# Patient Record
Sex: Female | Born: 1990 | Race: Black or African American | Hispanic: No | Marital: Single | Smoking: Former smoker
Health system: Southern US, Community
[De-identification: ages and names within clinical notes are randomized; demographics above are authoritative.]

## PROBLEM LIST (undated history)

## (undated) DIAGNOSIS — R011 Cardiac murmur, unspecified: Secondary | ICD-10-CM

## (undated) HISTORY — PX: HAND SURGERY: SHX662

---
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
## 2009-02-08 ENCOUNTER — Ambulatory Visit: Payer: Self-pay

## 2009-08-05 ENCOUNTER — Emergency Department: Payer: Self-pay | Admitting: Emergency Medicine

## 2011-09-05 ENCOUNTER — Emergency Department: Payer: Self-pay | Admitting: Emergency Medicine

## 2011-12-05 ENCOUNTER — Emergency Department: Payer: Self-pay | Admitting: Emergency Medicine

## 2012-02-01 ENCOUNTER — Emergency Department: Payer: Self-pay | Admitting: Unknown Physician Specialty

## 2012-04-21 ENCOUNTER — Emergency Department: Payer: Self-pay | Admitting: Emergency Medicine

## 2012-04-21 LAB — COMPREHENSIVE METABOLIC PANEL
Albumin: 3 g/dL — ABNORMAL LOW (ref 3.4–5.0)
Alkaline Phosphatase: 67 U/L (ref 50–136)
Anion Gap: 6 — ABNORMAL LOW (ref 7–16)
Bilirubin,Total: 0.1 mg/dL — ABNORMAL LOW (ref 0.2–1.0)
Calcium, Total: 8.1 mg/dL — ABNORMAL LOW (ref 8.5–10.1)
Chloride: 107 mmol/L (ref 98–107)
Co2: 26 mmol/L (ref 21–32)
EGFR (African American): >60
SGOT(AST): 16 U/L (ref 15–37)
Total Protein: 7.8 g/dL (ref 6.4–8.2)

## 2012-04-21 LAB — CBC
HCT: 31.9 % — ABNORMAL LOW (ref 35.0–47.0)
HGB: 10.1 g/dL — ABNORMAL LOW (ref 12.0–16.0)
MCV: 66 fL — ABNORMAL LOW (ref 80–100)
RBC: 4.82 10*6/uL (ref 3.80–5.20)
RDW: 18.4 % — ABNORMAL HIGH (ref 11.5–14.5)
WBC: 6.9 10*3/uL (ref 3.6–11.0)

## 2012-04-21 LAB — SALICYLATE LEVEL: Salicylates, Serum: 1.8 mg/dL

## 2012-04-21 LAB — ETHANOL
Ethanol %: <0.003 % (ref 0.000–0.080)
Ethanol: <3 mg/dL

## 2012-04-22 LAB — DRUG SCREEN, URINE
Amphetamines, Ur Screen: NEGATIVE (ref ?–1000)
Benzodiazepine, Ur Scrn: NEGATIVE (ref ?–200)
MDMA (Ecstasy)Ur Screen: NEGATIVE (ref ?–500)
Methadone, Ur Screen: NEGATIVE (ref ?–300)
Phencyclidine (PCP) Ur S: NEGATIVE (ref ?–25)
Tricyclic, Ur Screen: NEGATIVE (ref ?–1000)

## 2012-04-22 LAB — ACETAMINOPHEN LEVEL: Acetaminophen: <2 ug/mL

## 2012-05-04 ENCOUNTER — Emergency Department: Payer: Self-pay | Admitting: Emergency Medicine

## 2012-05-04 LAB — CBC
HCT: 35.2 % (ref 35.0–47.0)
HGB: 11.4 g/dL — ABNORMAL LOW (ref 12.0–16.0)
MCV: 65 fL — ABNORMAL LOW (ref 80–100)
Platelet: 265 10*3/uL (ref 150–440)
RBC: 5.44 10*6/uL — ABNORMAL HIGH (ref 3.80–5.20)
RDW: 17.7 % — ABNORMAL HIGH (ref 11.5–14.5)
WBC: 5.6 10*3/uL (ref 3.6–11.0)

## 2012-05-04 LAB — URINALYSIS, COMPLETE
Bacteria: NONE SEEN
Blood: NEGATIVE
Glucose,UR: NEGATIVE mg/dL (ref 0–75)
Nitrite: NEGATIVE
Ph: 6 (ref 4.5–8.0)
Protein: 30
RBC,UR: 4 /HPF (ref 0–5)
Specific Gravity: 1.03 (ref 1.003–1.030)
Squamous Epithelial: 7

## 2012-05-04 LAB — COMPREHENSIVE METABOLIC PANEL
Albumin: 3.4 g/dL (ref 3.4–5.0)
Alkaline Phosphatase: 75 U/L (ref 50–136)
Anion Gap: 9 (ref 7–16)
Calcium, Total: 8.9 mg/dL (ref 8.5–10.1)
Chloride: 105 mmol/L (ref 98–107)
EGFR (African American): >60
EGFR (Non-African Amer.): >60
Glucose: 124 mg/dL — ABNORMAL HIGH (ref 65–99)
Osmolality: 273 (ref 275–301)
Potassium: 3.4 mmol/L — ABNORMAL LOW (ref 3.5–5.1)
SGOT(AST): 18 U/L (ref 15–37)
Sodium: 137 mmol/L (ref 136–145)

## 2012-05-04 LAB — PREGNANCY, URINE: Pregnancy Test, Urine: NEGATIVE m[IU]/mL

## 2012-05-05 LAB — WET PREP, GENITAL

## 2012-09-12 ENCOUNTER — Emergency Department: Payer: Self-pay | Admitting: Emergency Medicine

## 2012-09-12 LAB — COMPREHENSIVE METABOLIC PANEL
Alkaline Phosphatase: 77 U/L (ref 50–136)
Anion Gap: 6 — ABNORMAL LOW (ref 7–16)
Calcium, Total: 8.9 mg/dL (ref 8.5–10.1)
Chloride: 104 mmol/L (ref 98–107)
Osmolality: 269 (ref 275–301)
SGOT(AST): 20 U/L (ref 15–37)
SGPT (ALT): 21 U/L (ref 12–78)
Sodium: 136 mmol/L (ref 136–145)

## 2012-09-12 LAB — URINALYSIS, COMPLETE
Bacteria: NONE SEEN
Bilirubin,UR: NEGATIVE
Glucose,UR: NEGATIVE mg/dL (ref 0–75)
Ketone: NEGATIVE
Leukocyte Esterase: NEGATIVE
Nitrite: NEGATIVE
Protein: NEGATIVE
RBC,UR: 1 /HPF (ref 0–5)
Specific Gravity: 1.021 (ref 1.003–1.030)
Squamous Epithelial: 7

## 2012-09-12 LAB — CBC
HCT: 34.3 % — ABNORMAL LOW (ref 35.0–47.0)
MCHC: 31.4 g/dL — ABNORMAL LOW (ref 32.0–36.0)
RBC: 5.26 10*6/uL — ABNORMAL HIGH (ref 3.80–5.20)
RDW: 18.3 % — ABNORMAL HIGH (ref 11.5–14.5)
WBC: 6.2 10*3/uL (ref 3.6–11.0)

## 2012-09-25 ENCOUNTER — Emergency Department: Payer: Self-pay | Admitting: Internal Medicine

## 2012-12-21 ENCOUNTER — Emergency Department: Payer: Self-pay | Admitting: Emergency Medicine

## 2012-12-23 LAB — BETA STREP CULTURE(ARMC)

## 2013-02-05 ENCOUNTER — Emergency Department: Payer: Self-pay | Admitting: Emergency Medicine

## 2013-03-09 ENCOUNTER — Emergency Department: Payer: Self-pay | Admitting: Internal Medicine

## 2013-03-09 LAB — URINALYSIS, COMPLETE
Bilirubin,UR: NEGATIVE
Glucose,UR: NEGATIVE mg/dL (ref 0–75)
Ketone: NEGATIVE
Leukocyte Esterase: NEGATIVE
Nitrite: NEGATIVE
RBC,UR: 1 /HPF (ref 0–5)
Squamous Epithelial: 8
WBC UR: 2 /HPF (ref 0–5)

## 2013-03-09 LAB — COMPREHENSIVE METABOLIC PANEL
Albumin: 3 g/dL — ABNORMAL LOW (ref 3.4–5.0)
Alkaline Phosphatase: 74 U/L (ref 50–136)
Anion Gap: 5 — ABNORMAL LOW (ref 7–16)
BUN: 6 mg/dL — ABNORMAL LOW (ref 7–18)
Chloride: 105 mmol/L (ref 98–107)
Creatinine: 0.81 mg/dL (ref 0.60–1.30)
EGFR (African American): >60
EGFR (Non-African Amer.): >60
Glucose: 92 mg/dL (ref 65–99)
Potassium: 3.5 mmol/L (ref 3.5–5.1)
SGPT (ALT): 12 U/L (ref 12–78)
Sodium: 138 mmol/L (ref 136–145)
Total Protein: 7.6 g/dL (ref 6.4–8.2)

## 2013-03-09 LAB — CBC
HCT: 34.2 % — ABNORMAL LOW (ref 35.0–47.0)
HGB: 10.9 g/dL — ABNORMAL LOW (ref 12.0–16.0)
MCHC: 31.8 g/dL — ABNORMAL LOW (ref 32.0–36.0)
MCV: 66 fL — ABNORMAL LOW (ref 80–100)
RBC: 5.15 10*6/uL (ref 3.80–5.20)
WBC: 6.6 10*3/uL (ref 3.6–11.0)

## 2013-06-06 ENCOUNTER — Emergency Department: Payer: Self-pay | Admitting: Emergency Medicine

## 2013-06-06 LAB — URINALYSIS, COMPLETE
Blood: NEGATIVE
Nitrite: NEGATIVE
Protein: NEGATIVE
Specific Gravity: 1.026 (ref 1.003–1.030)
Squamous Epithelial: 3
WBC UR: 2 /HPF (ref 0–5)

## 2013-06-06 LAB — PREGNANCY, URINE: Pregnancy Test, Urine: NEGATIVE m[IU]/mL

## 2013-06-06 LAB — HCG, QUANTITATIVE, PREGNANCY: Beta Hcg, Quant.: <1 m[IU]/mL — ABNORMAL LOW

## 2013-06-30 ENCOUNTER — Emergency Department: Payer: Self-pay | Admitting: Emergency Medicine

## 2013-06-30 LAB — BASIC METABOLIC PANEL
ANION GAP: 7 (ref 7–16)
BUN: 6 mg/dL — AB (ref 7–18)
Calcium, Total: 8.9 mg/dL (ref 8.5–10.1)
Chloride: 102 mmol/L (ref 98–107)
Co2: 24 mmol/L (ref 21–32)
Creatinine: 0.82 mg/dL (ref 0.60–1.30)
EGFR (African American): >60
GLUCOSE: 86 mg/dL (ref 65–99)
OSMOLALITY: 263 (ref 275–301)
Potassium: 3.4 mmol/L — ABNORMAL LOW (ref 3.5–5.1)
Sodium: 133 mmol/L — ABNORMAL LOW (ref 136–145)

## 2013-06-30 LAB — CBC WITH DIFFERENTIAL/PLATELET
BASOS PCT: 0.5 %
Basophil #: 0.1 10*3/uL (ref 0.0–0.1)
EOS PCT: 0.1 %
Eosinophil #: 0 10*3/uL (ref 0.0–0.7)
HCT: 34.9 % — ABNORMAL LOW (ref 35.0–47.0)
HGB: 10.9 g/dL — AB (ref 12.0–16.0)
LYMPHS ABS: 1.8 10*3/uL (ref 1.0–3.6)
LYMPHS PCT: 12 %
MCH: 20.3 pg — ABNORMAL LOW (ref 26.0–34.0)
MCHC: 31.1 g/dL — AB (ref 32.0–36.0)
MCV: 65 fL — ABNORMAL LOW (ref 80–100)
Monocyte #: 1.3 x10 3/mm — ABNORMAL HIGH (ref 0.2–0.9)
Monocyte %: 8.7 %
Neutrophil #: 11.9 10*3/uL — ABNORMAL HIGH (ref 1.4–6.5)
Neutrophil %: 78.7 %
PLATELETS: 276 10*3/uL (ref 150–440)
RBC: 5.34 10*6/uL — ABNORMAL HIGH (ref 3.80–5.20)
RDW: 17.7 % — ABNORMAL HIGH (ref 11.5–14.5)
WBC: 15.1 10*3/uL — AB (ref 3.6–11.0)

## 2013-06-30 LAB — MONONUCLEOSIS SCREEN: MONO TEST: NEGATIVE

## 2013-07-02 LAB — BETA STREP CULTURE(ARMC)

## 2013-11-10 ENCOUNTER — Emergency Department: Payer: Self-pay | Admitting: Emergency Medicine

## 2014-01-11 ENCOUNTER — Emergency Department: Payer: Self-pay | Admitting: Emergency Medicine

## 2014-01-12 LAB — COMPREHENSIVE METABOLIC PANEL
ALBUMIN: 3.8 g/dL (ref 3.4–5.0)
AST: 51 U/L — AB (ref 15–37)
Alkaline Phosphatase: 75 U/L
Anion Gap: 8 (ref 7–16)
BILIRUBIN TOTAL: 0.3 mg/dL (ref 0.2–1.0)
BUN: 8 mg/dL (ref 7–18)
CHLORIDE: 103 mmol/L (ref 98–107)
CO2: 27 mmol/L (ref 21–32)
Calcium, Total: 8.8 mg/dL (ref 8.5–10.1)
Creatinine: 0.89 mg/dL (ref 0.60–1.30)
EGFR (African American): >60
EGFR (Non-African Amer.): >60
Glucose: 94 mg/dL (ref 65–99)
Osmolality: 274 (ref 275–301)
Potassium: 3.5 mmol/L (ref 3.5–5.1)
SGPT (ALT): 28 U/L
Sodium: 138 mmol/L (ref 136–145)
TOTAL PROTEIN: 8.7 g/dL — AB (ref 6.4–8.2)

## 2014-01-12 LAB — CBC WITH DIFFERENTIAL/PLATELET
BASOS ABS: 0 10*3/uL (ref 0.0–0.1)
BASOS PCT: 0.5 %
EOS ABS: 0.2 10*3/uL (ref 0.0–0.7)
Eosinophil %: 1.8 %
HCT: 36.2 % (ref 35.0–47.0)
HGB: 11.2 g/dL — ABNORMAL LOW (ref 12.0–16.0)
Lymphocyte #: 3.2 10*3/uL (ref 1.0–3.6)
Lymphocyte %: 37.3 %
MCH: 20.8 pg — ABNORMAL LOW (ref 26.0–34.0)
MCHC: 30.9 g/dL — ABNORMAL LOW (ref 32.0–36.0)
MCV: 67 fL — ABNORMAL LOW (ref 80–100)
MONOS PCT: 11.2 %
Monocyte #: 1 x10 3/mm — ABNORMAL HIGH (ref 0.2–0.9)
NEUTROS ABS: 4.2 10*3/uL (ref 1.4–6.5)
NEUTROS PCT: 49.2 %
PLATELETS: 273 10*3/uL (ref 150–440)
RBC: 5.38 10*6/uL — ABNORMAL HIGH (ref 3.80–5.20)
RDW: 18.1 % — ABNORMAL HIGH (ref 11.5–14.5)
WBC: 8.6 10*3/uL (ref 3.6–11.0)

## 2014-01-12 LAB — URINALYSIS, COMPLETE
Bilirubin,UR: NEGATIVE
Blood: NEGATIVE
Glucose,UR: NEGATIVE mg/dL (ref 0–75)
KETONE: NEGATIVE
LEUKOCYTE ESTERASE: NEGATIVE
Nitrite: NEGATIVE
PH: 6 (ref 4.5–8.0)
RBC,UR: 9 /HPF (ref 0–5)
Specific Gravity: 1.03 (ref 1.003–1.030)
Squamous Epithelial: 5

## 2014-01-12 LAB — GC/CHLAMYDIA PROBE AMP

## 2014-01-12 LAB — WET PREP, GENITAL

## 2014-03-03 ENCOUNTER — Emergency Department: Payer: Self-pay | Admitting: Emergency Medicine

## 2014-03-03 LAB — CBC
HCT: 35.5 % (ref 35.0–47.0)
HGB: 10.7 g/dL — ABNORMAL LOW (ref 12.0–16.0)
MCH: 20 pg — ABNORMAL LOW (ref 26.0–34.0)
MCHC: 30 g/dL — AB (ref 32.0–36.0)
MCV: 67 fL — AB (ref 80–100)
PLATELETS: 287 10*3/uL (ref 150–440)
RBC: 5.33 10*6/uL — ABNORMAL HIGH (ref 3.80–5.20)
RDW: 17.7 % — ABNORMAL HIGH (ref 11.5–14.5)
WBC: 6 10*3/uL (ref 3.6–11.0)

## 2014-06-08 DIAGNOSIS — E669 Obesity, unspecified: Secondary | ICD-10-CM | POA: Insufficient documentation

## 2014-06-13 ENCOUNTER — Emergency Department: Payer: Self-pay | Admitting: Student

## 2014-06-13 LAB — URINALYSIS, COMPLETE
BILIRUBIN, UR: NEGATIVE
BLOOD: NEGATIVE
Glucose,UR: NEGATIVE mg/dL (ref 0–75)
KETONE: NEGATIVE
Nitrite: NEGATIVE
PH: 6 (ref 4.5–8.0)
Protein: 30
RBC,UR: 3 /HPF (ref 0–5)
Specific Gravity: 1.026 (ref 1.003–1.030)
Squamous Epithelial: 18
WBC UR: 6 /HPF (ref 0–5)

## 2014-06-13 LAB — WET PREP, GENITAL

## 2014-06-13 LAB — CBC WITH DIFFERENTIAL/PLATELET
BASOS PCT: 0.4 %
Basophil #: 0 10*3/uL (ref 0.0–0.1)
Eosinophil #: 0.1 10*3/uL (ref 0.0–0.7)
Eosinophil %: 1.6 %
HCT: 33.8 % — ABNORMAL LOW (ref 35.0–47.0)
HGB: 10.5 g/dL — AB (ref 12.0–16.0)
LYMPHS ABS: 2.3 10*3/uL (ref 1.0–3.6)
Lymphocyte %: 31.5 %
MCH: 20.7 pg — AB (ref 26.0–34.0)
MCHC: 31 g/dL — ABNORMAL LOW (ref 32.0–36.0)
MCV: 67 fL — ABNORMAL LOW (ref 80–100)
Monocyte #: 0.7 x10 3/mm (ref 0.2–0.9)
Monocyte %: 9.7 %
Neutrophil #: 4.1 10*3/uL (ref 1.4–6.5)
Neutrophil %: 56.8 %
Platelet: 357 10*3/uL (ref 150–440)
RBC: 5.06 10*6/uL (ref 3.80–5.20)
RDW: 17.3 % — AB (ref 11.5–14.5)
WBC: 7.2 10*3/uL (ref 3.6–11.0)

## 2014-06-13 LAB — COMPREHENSIVE METABOLIC PANEL
ALBUMIN: 3.7 g/dL (ref 3.4–5.0)
Alkaline Phosphatase: 82 U/L
Anion Gap: 3 — ABNORMAL LOW (ref 7–16)
BILIRUBIN TOTAL: 0.3 mg/dL (ref 0.2–1.0)
BUN: 7 mg/dL (ref 7–18)
Calcium, Total: 8.9 mg/dL (ref 8.5–10.1)
Chloride: 108 mmol/L — ABNORMAL HIGH (ref 98–107)
Co2: 28 mmol/L (ref 21–32)
Creatinine: 0.9 mg/dL (ref 0.60–1.30)
EGFR (African American): >60
EGFR (Non-African Amer.): >60
GLUCOSE: 93 mg/dL (ref 65–99)
OSMOLALITY: 275 (ref 275–301)
Potassium: 3.7 mmol/L (ref 3.5–5.1)
SGOT(AST): 18 U/L (ref 15–37)
SGPT (ALT): 20 U/L
SODIUM: 139 mmol/L (ref 136–145)
Total Protein: 8.8 g/dL — ABNORMAL HIGH (ref 6.4–8.2)

## 2014-06-13 LAB — GC/CHLAMYDIA PROBE AMP

## 2014-06-13 LAB — LIPASE, BLOOD: Lipase: 155 U/L (ref 73–393)

## 2014-07-11 ENCOUNTER — Emergency Department: Payer: Self-pay | Admitting: Internal Medicine

## 2014-07-11 LAB — URINALYSIS, COMPLETE
BACTERIA: NONE SEEN
BILIRUBIN, UR: NEGATIVE
Glucose,UR: NEGATIVE mg/dL (ref 0–75)
KETONE: NEGATIVE
Nitrite: NEGATIVE
Ph: 6 (ref 4.5–8.0)
Protein: 100
RBC,UR: 2974 /HPF (ref 0–5)
Specific Gravity: 1.025 (ref 1.003–1.030)
WBC UR: 7 /HPF (ref 0–5)

## 2014-07-11 LAB — HCG, QUANTITATIVE, PREGNANCY: Beta Hcg, Quant.: <1 m[IU]/mL — ABNORMAL LOW

## 2014-07-11 LAB — CBC
HCT: 32.8 % — ABNORMAL LOW (ref 35.0–47.0)
HGB: 10 g/dL — ABNORMAL LOW (ref 12.0–16.0)
MCH: 19.8 pg — ABNORMAL LOW (ref 26.0–34.0)
MCHC: 30.6 g/dL — AB (ref 32.0–36.0)
MCV: 65 fL — ABNORMAL LOW (ref 80–100)
Platelet: 306 10*3/uL (ref 150–440)
RBC: 5.08 10*6/uL (ref 3.80–5.20)
RDW: 16.5 % — AB (ref 11.5–14.5)
WBC: 8 10*3/uL (ref 3.6–11.0)

## 2014-07-30 ENCOUNTER — Emergency Department: Payer: Self-pay | Admitting: Emergency Medicine

## 2014-09-08 ENCOUNTER — Emergency Department: Payer: Self-pay | Admitting: Emergency Medicine

## 2015-03-22 ENCOUNTER — Encounter: Payer: Self-pay | Admitting: Emergency Medicine

## 2015-03-22 ENCOUNTER — Emergency Department: Payer: Self-pay

## 2015-03-22 ENCOUNTER — Emergency Department
Admission: EM | Admit: 2015-03-22 | Discharge: 2015-03-22 | Disposition: A | Discharge status: Home / Self Care | Payer: Self-pay | Attending: Emergency Medicine | Admitting: Emergency Medicine

## 2015-03-22 DIAGNOSIS — Y9289 Other specified places as the place of occurrence of the external cause: Secondary | ICD-10-CM | POA: Insufficient documentation

## 2015-03-22 DIAGNOSIS — Z87891 Personal history of nicotine dependence: Secondary | ICD-10-CM | POA: Insufficient documentation

## 2015-03-22 DIAGNOSIS — W208XXA Other cause of strike by thrown, projected or falling object, initial encounter: Secondary | ICD-10-CM | POA: Insufficient documentation

## 2015-03-22 DIAGNOSIS — Y998 Other external cause status: Secondary | ICD-10-CM | POA: Insufficient documentation

## 2015-03-22 DIAGNOSIS — Y9389 Activity, other specified: Secondary | ICD-10-CM | POA: Insufficient documentation

## 2015-03-22 DIAGNOSIS — S9031XA Contusion of right foot, initial encounter: Secondary | ICD-10-CM | POA: Insufficient documentation

## 2015-03-22 MED ORDER — IBUPROFEN 800 MG PO TABS
800.0000 mg | ORAL_TABLET | Freq: Once | ORAL | Status: AC
  Administered 2015-03-22: 800 mg via ORAL
  Filled 2015-03-22: qty 1

## 2015-03-22 MED ORDER — IBUPROFEN 800 MG PO TABS: 800.0000 mg | ORAL_TABLET | Freq: Three times a day (TID) | ORAL | Status: DC | PRN

## 2015-03-22 MED ORDER — TRAMADOL HCL 50 MG PO TABS: 50.0000 mg | ORAL_TABLET | Freq: Four times a day (QID) | ORAL | Status: DC | PRN

## 2015-03-22 MED ORDER — TRAMADOL HCL 50 MG PO TABS
50.0000 mg | ORAL_TABLET | Freq: Once | ORAL | Status: AC
  Administered 2015-03-22: 50 mg via ORAL
  Filled 2015-03-22: qty 1

## 2015-03-22 NOTE — ED Provider Notes (Signed)
[facility] Emergency Department Provider Note  ____________________________________________  Time seen: Approximately 2:42 PM  I have reviewed the triage vital signs and the nursing notes.   HISTORY  Chief Complaint Ankle Pain    HPI Linda Sawyer is a 24 y.o. female complaining of right ankle pain as a dropping a heavy box on the ankle this morning. Patient states pain is 8/10. Describes pain as sharp. Except for ice placed on the ankle was triaged no other palliative measures for this complaint.   History reviewed. No pertinent past medical history.  There are no active problems to display for this patient.   No past surgical history on file.  No current outpatient prescriptions on file.  Allergies Review of patient's allergies indicates no known allergies.  No family history on file.  Social History Social History  Substance Use Topics  . Smoking status: Former Smoker    Quit date: 03/01/2015  . Smokeless tobacco: None  . Alcohol Use: No    Review of Systems Constitutional: No fever/chills Eyes: No visual changes. ENT: No sore throat. Cardiovascular: Denies chest pain. Respiratory: Denies shortness of breath. Gastrointestinal: No abdominal pain.  No nausea, no vomiting.  No diarrhea.  No constipation. Genitourinary: Negative for dysuria. Musculoskeletal: Right ankle pain Skin: Negative for rash. Neurological: Negative for headaches, focal weakness or numbness. 10-point ROS otherwise negative.  ____________________________________________   PHYSICAL EXAM:  VITAL SIGNS: ED Triage Vitals  Enc Vitals Group     BP 03/22/15 1428 134/80 mmHg     Pulse Rate 03/22/15 1428 65     Resp 03/22/15 1428 16     Temp 03/22/15 1428 98.4 F (36.9 C)     Temp Source 03/22/15 1428 Oral     SpO2 03/22/15 1428 99 %     Weight 03/22/15 1428 310 lb (140.615 kg)     Height 03/22/15 1428  (1.778 m)     Head Cir --      Peak Flow --    Pain Score 03/22/15 1429 8     Pain Loc --      Pain Edu? --      Excl. in GC? --     Constitutional: Alert and oriented. Well appearing and in no acute distress. Eyes: Conjunctivae are normal. PERRL. EOMI. Head: Atraumatic. Nose: No congestion/rhinnorhea. Mouth/Throat: Mucous membranes are moist.  Oropharynx non-erythematous. Neck: No stridor.  No cervical spine tenderness to palpation. Hematological/Lymphatic/Immunilogical: No cervical lymphadenopathy. Cardiovascular: Normal rate, regular rhythm. Grossly normal heart sounds.  Good peripheral circulation. Respiratory: Normal respiratory effort.  No retractions. Lungs CTAB. Gastrointestinal: Soft and nontender. No distention. No abdominal bruits. No CVA tenderness. Musculoskeletal: No lower extremity tenderness nor edema.  No joint effusions. Neurologic:  Normal speech and language. No gross focal neurologic deficits are appreciated. No gait instability. Skin:  Skin is warm, dry and intact. No rash noted. Psychiatric: Mood and affect are normal. Speech and behavior are normal.  ____________________________________________   LABS (all labs ordered are listed, but only abnormal results are displayed)  Labs Reviewed - No data to display ____________________________________________  EKG   ____________________________________________  RADIOLOGY  Right ankle x-rays unremarkable. I, Joni Reining, personally viewed and evaluated these images (plain radiographs) as part of my medical decision making.   ____________________________________________   PROCEDURES  Procedure(s) performed: None  Critical Care performed: No  ____________________________________________   INITIAL IMPRESSION / ASSESSMENT AND PLAN / ED COURSE  Pertinent labs & imaging results that were available  during my care of the patient were reviewed by me and considered in my medical decision making (see chart for details).  Right foot contusion. Patient  advised to wear open shoe for 2-3 days. Patient given a prescription for tramadol and ibuprofen. Patient given a work note for 2 days. [facility] if condition persists. ____________________________________________   FINAL CLINICAL IMPRESSION(S) / ED DIAGNOSES  Final diagnoses:  Foot contusion, right, initial encounter      Joni Reining, PA-C 03/22/15 1530  Myrna Blazer, MD 03/22/15 [phone]

## 2015-03-22 NOTE — Discharge Instructions (Signed)
Foot Contusion  A foot contusion is a deep bruise to the foot. Contusions happen when an injury causes bleeding under the skin. Signs of bruising include pain, puffiness (swelling), and discolored skin. The contusion may turn blue, purple, or yellow. HOME CARE  Put ice on the injured area.  Put ice in a plastic bag.  Place a towel between your skin and the bag.  Leave the ice on for 15-20 minutes, 03-04 times a day.  Only take medicines as told by your doctor.  Use an elastic wrap only as told. You may remove the wrap for sleeping, showering, and bathing. Take the wrap off if you lose feeling (numb) in your toes, or they turn blue or cold. Put the wrap on more loosely.  Keep the foot raised (elevated) with pillows.  If your foot hurts, avoid standing or walking.  When your doctor says it is okay to use your foot, start using it slowly. If you have pain, lessen how much you use your foot.  See your doctor as told. GET HELP RIGHT AWAY IF:   You have more redness, puffiness, or pain in your foot.  Your puffiness or pain does not get better with medicine.  You lose feeling in your foot, or you cannot move your toes.  Your foot turns cold or blue.  You have pain when you move your toes.  Your foot feels warm.  Your contusion does not get better in 2 days. MAKE SURE YOU:   Understand these instructions.  Will watch this condition.  Will get help right away if you or your child is not doing well or gets worse.   This information is not intended to replace advice given to you by your health care provider. Make sure you discuss any questions you have with your health care provider.   Document Released: 03/07/2008 Document Revised: 11/28/2011 Document Reviewed: 02/02/2015 Elsevier Interactive Patient Education 2016 [employer]  

## 2015-03-22 NOTE — ED Notes (Signed)
Patient presents to the ED with right ankle pain after dropping a box on her right ankle around 10am.  Patient is in no obvious distress at this time.

## 2015-03-25 ENCOUNTER — Emergency Department
Admission: EM | Admit: 2015-03-25 | Discharge: 2015-03-25 | Disposition: A | Discharge status: Home / Self Care | Payer: Self-pay | Attending: Emergency Medicine | Admitting: Emergency Medicine

## 2015-03-25 ENCOUNTER — Encounter: Payer: Self-pay | Admitting: Emergency Medicine

## 2015-03-25 ENCOUNTER — Emergency Department: Payer: Self-pay

## 2015-03-25 DIAGNOSIS — S9002XD Contusion of left ankle, subsequent encounter: Secondary | ICD-10-CM

## 2015-03-25 DIAGNOSIS — Z87891 Personal history of nicotine dependence: Secondary | ICD-10-CM | POA: Insufficient documentation

## 2015-03-25 DIAGNOSIS — W208XXD Other cause of strike by thrown, projected or falling object, subsequent encounter: Secondary | ICD-10-CM | POA: Insufficient documentation

## 2015-03-25 DIAGNOSIS — S9001XD Contusion of right ankle, subsequent encounter: Secondary | ICD-10-CM | POA: Insufficient documentation

## 2015-03-25 MED ORDER — TRAMADOL HCL 50 MG PO TABS: 50.0000 mg | ORAL_TABLET | Freq: Four times a day (QID) | ORAL | Status: DC | PRN

## 2015-03-25 MED ORDER — NAPROXEN 500 MG PO TABS: 500.0000 mg | ORAL_TABLET | Freq: Two times a day (BID) | ORAL | Status: DC

## 2015-03-25 MED ORDER — TRAMADOL HCL 50 MG PO TABS
50.0000 mg | ORAL_TABLET | Freq: Once | ORAL | Status: AC
  Administered 2015-03-25: 50 mg via ORAL
  Filled 2015-03-25: qty 1

## 2015-03-25 NOTE — ED Provider Notes (Signed)
[facility] Emergency Department Provider Note ____________________________________________  Time seen: Approximately 4:10 PM  I have reviewed the triage vital signs and the nursing notes.   HISTORY  Chief Complaint Ankle Pain   HPI Linda Sawyer is a 24 y.o. female who presents to the emergency department for a second evaluation of right ankle pain. She states that she was sitting with her legs in front of her, reached over to pick up a box of tools, and it slipped out of her hand and felt directly onto her right ankle. She states that she was examined on 03/22/2015 for the same and the x-ray was negative. She states that the pain has increased since her last visit and is unable to bear weight. She is not taking anything for pain. She is not wearing the postop shoe was given to her on the 10th. She has not scheduled a follow-up appointment [facility] or [facility] as previously advised.She states that she attempted to go to work today, but was unable to stay due to the pain.   History reviewed. No pertinent past medical history.  There are no active problems to display for this patient.   Past Surgical History  Procedure Laterality Date  . Hand surgery      Current Outpatient Rx  Name  Route  Sig  Dispense  Refill  . naproxen (NAPROSYN) 500 MG tablet   Oral   Take 1 tablet (500 mg total) by mouth 2 (two) times daily with a meal.   60 tablet   2   . traMADol (ULTRAM) 50 MG tablet   Oral   Take 1 tablet (50 mg total) by mouth every 6 (six) hours as needed.   9 tablet   0     Allergies Review of patient's allergies indicates no known allergies.  No family history on file.  Social History Social History  Substance Use Topics  . Smoking status: Former Smoker    Quit date: 03/01/2015  . Smokeless tobacco: None  . Alcohol Use: No    Review of Systems Constitutional: No recent illness. Eyes: No visual changes. ENT: No sore  throat. Cardiovascular: Denies chest pain or palpitations. Respiratory: Denies shortness of breath. Gastrointestinal: No abdominal pain.  Genitourinary: Negative for dysuria. Musculoskeletal: Pain in right ankle Skin: Negative for rash. Neurological: Negative for headaches, focal weakness or numbness. 10-point ROS otherwise negative.  ____________________________________________   PHYSICAL EXAM:  VITAL SIGNS: ED Triage Vitals  Enc Vitals Group     BP 03/25/15 1609 97/75 mmHg     Pulse Rate 03/25/15 1609 77     Resp 03/25/15 1609 18     Temp 03/25/15 1609 98.4 F (36.9 C)     Temp Source 03/25/15 1609 Oral     SpO2 03/25/15 1609 98 %     Weight 03/25/15 1600 310 lb (140.615 kg)     Height 03/25/15 1600 5\' 10"  (1.778 m)     Head Cir --      Peak Flow --      Pain Score 03/25/15 1600 8     Pain Loc --      Pain Edu? --      Excl. in GC? --     Constitutional: Alert and oriented. Well appearing and in no acute distress. Eyes: Conjunctivae are normal. EOMI. Head: Atraumatic. Nose: No congestion/rhinnorhea. Neck: No stridor.  Respiratory: Normal respiratory effort.   Musculoskeletal: Patient reports tenderness and pain with every point on  the [place] ankle rules exam. Neurologic:  Normal speech and language. No gross focal neurologic deficits are appreciated. Speech is normal. No gait instability. Skin:  Skin is warm, dry and intact. Atraumatic. Psychiatric: Mood and affect are normal. Speech and behavior are normal.  ____________________________________________   LABS (all labs ordered are listed, but only abnormal results are displayed)  Labs Reviewed - No data to display ____________________________________________  RADIOLOGY  Negative for acute bony abnormality. ____________________________________________   PROCEDURES  Procedure(s) performed:  SPLINT APPLICATION Date/Time: 3:31 PM Authorized by: Kem Boroughs Consent: Verbal consent obtained. Risks and  benefits: risks, benefits and alternatives were discussed Consent given by: patient Splint applied by: Theodoro Grist, ER Tech Location details: right foot/ankle Splint type: Velcro  Supplies used: Velcro stirrup splint. Post-procedure: The splinted body part was neurovascularly unchanged following the procedure. Patient tolerance: Patient tolerated the procedure well with no immediate complications.      ____________________________________________   INITIAL IMPRESSION / ASSESSMENT AND PLAN / ED COURSE  Pertinent labs & imaging results that were available during my care of the patient were reviewed by me and considered in my medical decision making (see chart for details).  Patient was advised to follow up with orthopedics for symptoms that are not improving over the week. She was advised to return to the emergency department for symptoms that change or worsen if unable to schedule an appointment. ____________________________________________   FINAL CLINICAL IMPRESSION(S) / ED DIAGNOSES  Final diagnoses:  Ankle contusion, left, subsequent encounter       Chinita Pester, FNP 03/26/15 1532  Sharman Cheek, MD 03/27/15 [phone]

## 2015-03-25 NOTE — ED Notes (Signed)
Pt dropped a box on her right ankle on Monday, states that today she feels its getting worse, unable to bear too much weight on it, states her ankle is swollen.

## 2015-03-25 NOTE — Discharge Instructions (Signed)
Follow up with the orthopedic doctor for symptoms that are not improving over the next few days.

## 2015-04-09 ENCOUNTER — Emergency Department
Admission: EM | Admit: 2015-04-09 | Discharge: 2015-04-09 | Disposition: A | Discharge status: Home / Self Care | Payer: Self-pay | Attending: Emergency Medicine | Admitting: Emergency Medicine

## 2015-04-09 DIAGNOSIS — Z87891 Personal history of nicotine dependence: Secondary | ICD-10-CM | POA: Insufficient documentation

## 2015-04-09 DIAGNOSIS — Z3202 Encounter for pregnancy test, result negative: Secondary | ICD-10-CM | POA: Insufficient documentation

## 2015-04-09 DIAGNOSIS — Z791 Long term (current) use of non-steroidal anti-inflammatories (NSAID): Secondary | ICD-10-CM | POA: Insufficient documentation

## 2015-04-09 DIAGNOSIS — N390 Urinary tract infection, site not specified: Secondary | ICD-10-CM | POA: Insufficient documentation

## 2015-04-09 LAB — URINALYSIS COMPLETE WITH MICROSCOPIC (ARMC ONLY)
BILIRUBIN URINE: NEGATIVE
GLUCOSE, UA: NEGATIVE mg/dL
HGB URINE DIPSTICK: NEGATIVE
Ketones, ur: NEGATIVE mg/dL
NITRITE: NEGATIVE
PH: 6 (ref 5.0–8.0)
Protein, ur: 100 mg/dL — AB
SPECIFIC GRAVITY, URINE: 1.017 (ref 1.005–1.030)

## 2015-04-09 LAB — CBC WITH DIFFERENTIAL/PLATELET
BASOS ABS: 0 10*3/uL (ref 0–0.1)
Basophils Relative: 1 %
Eosinophils Absolute: 0.1 10*3/uL (ref 0–0.7)
Eosinophils Relative: 1 %
HEMATOCRIT: 32.6 % — AB (ref 35.0–47.0)
HEMOGLOBIN: 10 g/dL — AB (ref 12.0–16.0)
LYMPHS ABS: 2.6 10*3/uL (ref 1.0–3.6)
LYMPHS PCT: 34 %
MCH: 19.5 pg — AB (ref 26.0–34.0)
MCHC: 30.5 g/dL — ABNORMAL LOW (ref 32.0–36.0)
MCV: 64 fL — AB (ref 80.0–100.0)
Monocytes Absolute: 0.6 10*3/uL (ref 0.2–0.9)
Monocytes Relative: 8 %
NEUTROS ABS: 4.3 10*3/uL (ref 1.4–6.5)
NEUTROS PCT: 56 %
Platelets: 298 10*3/uL (ref 150–440)
RBC: 5.1 MIL/uL (ref 3.80–5.20)
RDW: 19.7 % — ABNORMAL HIGH (ref 11.5–14.5)
WBC: 7.6 10*3/uL (ref 3.6–11.0)

## 2015-04-09 LAB — COMPREHENSIVE METABOLIC PANEL
ALT: 18 U/L (ref 14–54)
ANION GAP: 3 — AB (ref 5–15)
AST: 24 U/L (ref 15–41)
Albumin: 3.5 g/dL (ref 3.5–5.0)
Alkaline Phosphatase: 68 U/L (ref 38–126)
BUN: 8 mg/dL (ref 6–20)
CHLORIDE: 107 mmol/L (ref 101–111)
CO2: 26 mmol/L (ref 22–32)
Calcium: 8.7 mg/dL — ABNORMAL LOW (ref 8.9–10.3)
Creatinine, Ser: 0.86 mg/dL (ref 0.44–1.00)
Glucose, Bld: 105 mg/dL — ABNORMAL HIGH (ref 65–99)
POTASSIUM: 3.5 mmol/L (ref 3.5–5.1)
Sodium: 136 mmol/L (ref 135–145)
TOTAL PROTEIN: 8.1 g/dL (ref 6.5–8.1)
Total Bilirubin: 0.5 mg/dL (ref 0.3–1.2)

## 2015-04-09 LAB — PREGNANCY, URINE: PREG TEST UR: NEGATIVE

## 2015-04-09 LAB — LIPASE, BLOOD: Lipase: 29 U/L (ref 11–51)

## 2015-04-09 MED ORDER — SULFAMETHOXAZOLE-TRIMETHOPRIM 800-160 MG PO TABS
1.0000 | ORAL_TABLET | Freq: Once | ORAL | Status: AC
  Administered 2015-04-09: 1 via ORAL
  Filled 2015-04-09: qty 1

## 2015-04-09 MED ORDER — SULFAMETHOXAZOLE-TRIMETHOPRIM 800-160 MG PO TABS: 1.0000 | ORAL_TABLET | Freq: Two times a day (BID) | ORAL | Status: DC

## 2015-04-09 NOTE — ED Notes (Signed)
Patient comes complaining of right sided abdominal pain that gets worse with eating and drinking. Last normal BM 04/08/15

## 2015-04-09 NOTE — Discharge Instructions (Signed)

## 2015-04-09 NOTE — ED Provider Notes (Signed)
CSN: [id number]     Arrival date & time 04/09/15  1625 History   First MD Initiated Contact with Patient 04/09/15 1822     Chief Complaint  Patient presents with  . Abdominal Pain     (Consider location/radiation/quality/duration/timing/severity/associated sxs/prior Treatment) HPI  24 year old female presents to emergency department for evaluation of lower abdominal pain. Patient's pain has been present for 5-7 days. Symptoms have been off and on but increased today. She describes pressure in the lower pelvis with increased urinary frequency. She denies any burning sensation with urination. She does have mild lower back pain. Denies any vaginal discharge. Afebrile without nausea and vomiting.  History reviewed. No pertinent past medical history. Past Surgical History  Procedure Laterality Date  . Hand surgery     History reviewed. No pertinent family history. Social History  Substance Use Topics  . Smoking status: Former Smoker    Quit date: 03/01/2015  . Smokeless tobacco: None  . Alcohol Use: No   OB History    No data available     Review of Systems  Constitutional: Negative for fever, chills, activity change and fatigue.  HENT: Negative for congestion, sinus pressure and sore throat.   Eyes: Negative for visual disturbance.  Respiratory: Negative for cough, chest tightness and shortness of breath.   Cardiovascular: Negative for chest pain and leg swelling.  Gastrointestinal: Positive for abdominal pain. Negative for nausea, vomiting and diarrhea.  Genitourinary: Positive for frequency. Negative for dysuria.  Musculoskeletal: Negative for arthralgias and gait problem.  Skin: Negative for rash.  Neurological: Negative for weakness, numbness and headaches.  Hematological: Negative for adenopathy.  Psychiatric/Behavioral: Negative for behavioral problems, confusion and agitation.      Allergies  Review of patient's allergies indicates no known allergies.  Home  Medications   Prior to Admission medications   Medication Sig Start Date End Date Taking? Authorizing Provider  naproxen (NAPROSYN) 500 MG tablet Take 1 tablet (500 mg total) by mouth 2 (two) times daily with a meal. 03/25/15 03/24/16  Cari B Triplett, FNP  sulfamethoxazole-trimethoprim (BACTRIM DS,SEPTRA DS) 800-160 MG tablet Take 1 tablet by mouth 2 (two) times daily. X 7 days 04/09/15   Evon Slack, PA-C  traMADol (ULTRAM) 50 MG tablet Take 1 tablet (50 mg total) by mouth every 6 (six) hours as needed. 03/25/15   Cari B Triplett, FNP   BP 124/81 mmHg  Pulse 89  Temp(Src) 97.7 F (36.5 C)  Resp 16  Ht  (1.778 m)  Wt 310 lb (140.615 kg)  BMI 44.48 kg/m2  SpO2 98%  LMP 02/20/2015 Physical Exam  Constitutional: She is oriented to person, place, and time. She appears well-developed and well-nourished. No distress.  HENT:  Head: Normocephalic and atraumatic.  Mouth/Throat: Oropharynx is clear and moist.  Eyes: EOM are normal. Pupils are equal, round, and reactive to light. Right eye exhibits no discharge. Left eye exhibits no discharge.  Neck: Normal range of motion. Neck supple.  Cardiovascular: Normal rate, regular rhythm and intact distal pulses.   Pulmonary/Chest: Effort normal and breath sounds normal. No respiratory distress. She exhibits no tenderness.  Abdominal: Soft. She exhibits no distension and no mass. There is tenderness ( lower pelvic tenderness midline, mild with out guarding). There is no rebound and no guarding.  Musculoskeletal: Normal range of motion. She exhibits no edema.  Neurological: She is alert and oriented to person, place, and time. She has normal reflexes.  Skin: Skin is warm and dry.  Psychiatric: She  has a normal mood and affect. Her behavior is normal. Thought content normal.  Nursing note and vitals reviewed.   ED Course  Procedures (including critical care time) Labs Review Labs Reviewed  COMPREHENSIVE METABOLIC PANEL - Abnormal;  Notable for the following:    Glucose, Bld 105 (*)    Calcium 8.7 (*)    Anion gap 3 (*)    All other components within normal limits  CBC WITH DIFFERENTIAL/PLATELET - Abnormal; Notable for the following:    Hemoglobin 10.0 (*)    HCT 32.6 (*)    MCV 64.0 (*)    MCH 19.5 (*)    MCHC 30.5 (*)    RDW 19.7 (*)    All other components within normal limits  URINALYSIS COMPLETEWITH MICROSCOPIC ([facility] ONLY) - Abnormal; Notable for the following:    Color, Urine YELLOW (*)    APPearance HAZY (*)    Protein, ur 100 (*)    Leukocytes, UA 2+ (*)    Bacteria, UA RARE (*)    Squamous Epithelial / LPF 6-30 (*)    All other components within normal limits  URINE CULTURE  LIPASE, BLOOD  PREGNANCY, URINE    Imaging Review No results found. I have personally reviewed and evaluated these images and lab results as part of my medical decision-making.   EKG Interpretation None      MDM   Final diagnoses:  UTI (lower urinary tract infection)    24 year old female presents to the emergency department with lower pelvic pain and pressure. Vital signs are within normal limits. Labs were normal. Urine showed positive urinary tract infection. Patient started on Bactrim DS 1 tab by mouth twice a day for 7 days. Urine culture ordered. Return to the ER for any worsening symptoms or for any urgent changes in your health.    Evon Slackhomas C Palak Tercero, PA-C 04/09/15 1921  Emily FilbertJonathan E Williams, MD 04/09/15 2249

## 2015-04-12 LAB — URINE CULTURE

## 2015-05-19 ENCOUNTER — Emergency Department
Admission: EM | Admit: 2015-05-19 | Discharge: 2015-05-19 | Disposition: A | Discharge status: Home / Self Care | Payer: Self-pay | Attending: Emergency Medicine | Admitting: Emergency Medicine

## 2015-05-19 ENCOUNTER — Encounter: Payer: Self-pay | Admitting: Emergency Medicine

## 2015-05-19 DIAGNOSIS — Z791 Long term (current) use of non-steroidal anti-inflammatories (NSAID): Secondary | ICD-10-CM | POA: Insufficient documentation

## 2015-05-19 DIAGNOSIS — K0381 Cracked tooth: Secondary | ICD-10-CM | POA: Insufficient documentation

## 2015-05-19 DIAGNOSIS — Z87891 Personal history of nicotine dependence: Secondary | ICD-10-CM | POA: Insufficient documentation

## 2015-05-19 DIAGNOSIS — K047 Periapical abscess without sinus: Secondary | ICD-10-CM | POA: Insufficient documentation

## 2015-05-19 MED ORDER — HYDROCODONE-ACETAMINOPHEN 5-325 MG PO TABS: 1.0000 | ORAL_TABLET | ORAL | Status: DC | PRN

## 2015-05-19 MED ORDER — AMOXICILLIN 500 MG PO TABS: 500.0000 mg | ORAL_TABLET | Freq: Three times a day (TID) | ORAL | Status: DC

## 2015-05-19 MED ORDER — IBUPROFEN 800 MG PO TABS: 800.0000 mg | ORAL_TABLET | Freq: Three times a day (TID) | ORAL | Status: DC | PRN

## 2015-05-19 NOTE — ED Notes (Signed)
pt reports toothdache for the past couple days states today got worse with facial swelling

## 2015-05-19 NOTE — Discharge Instructions (Signed)

## 2015-05-19 NOTE — ED Provider Notes (Signed)
[facility] Emergency Department Provider Note  ____________________________________________  Time seen: Approximately 10:17 PM  I have reviewed the triage vital signs and the nursing notes.   HISTORY  Chief Complaint Dental Pain   HPI Linda Sawyer is a 24 y.o. female who presents to the emergency department for dental pain. She states that she's had pain for the past 2 days, but noticed swelling this morning. She has a fractured tooth in the back that has been there for several years. She has taken ibuprofen without relief.   History reviewed. No pertinent past medical history.  There are no active problems to display for this patient.   Past Surgical History  Procedure Laterality Date  . Hand surgery      Current Outpatient Rx  Name  Route  Sig  Dispense  Refill  . amoxicillin (AMOXIL) 500 MG tablet   Oral   Take 1 tablet (500 mg total) by mouth 3 (three) times daily.   30 tablet   0   . HYDROcodone-acetaminophen (NORCO/VICODIN) 5-325 MG tablet   Oral   Take 1 tablet by mouth every 4 (four) hours as needed for moderate pain.   9 tablet   0   . ibuprofen (ADVIL,MOTRIN) 800 MG tablet   Oral   Take 1 tablet (800 mg total) by mouth every 8 (eight) hours as needed.   30 tablet   0   . naproxen (NAPROSYN) 500 MG tablet   Oral   Take 1 tablet (500 mg total) by mouth 2 (two) times daily with a meal.   60 tablet   2   . sulfamethoxazole-trimethoprim (BACTRIM DS,SEPTRA DS) 800-160 MG tablet   Oral   Take 1 tablet by mouth 2 (two) times daily. X 7 days   14 tablet   0   . traMADol (ULTRAM) 50 MG tablet   Oral   Take 1 tablet (50 mg total) by mouth every 6 (six) hours as needed.   9 tablet   0     Allergies Review of patient's allergies indicates no known allergies.  History reviewed. No pertinent family history.  Social History Social History  Substance Use Topics  . Smoking status: Former Smoker    Quit date: 03/01/2015  .  Smokeless tobacco: None  . Alcohol Use: No    Review of Systems Constitutional: No fever/chills Eyes: No visual changes. ENT: No sore throat. Cardiovascular: Denies chest pain. Respiratory: Denies shortness of breath. Gastrointestinal: No abdominal pain.  No nausea, no vomiting.  Genitourinary: Negative for dysuria. Musculoskeletal: Negative for back pain. Skin: Negative for rash. Neurological: Negative for headaches, focal weakness or numbness. 10-point ROS otherwise negative.  ____________________________________________   PHYSICAL EXAM:  VITAL SIGNS: ED Triage Vitals  Enc Vitals Group     BP 05/19/15 2131 151/101 mmHg     Pulse Rate 05/19/15 2131 58     Resp 05/19/15 2131 20     Temp 05/19/15 2131 98.7 F (37.1 C)     Temp Source 05/19/15 2131 Oral     SpO2 05/19/15 2131 99 %     Weight 05/19/15 2131 310 lb (140.615 kg)     Height 05/19/15 2131  (1.778 m)     Head Cir --      Peak Flow --      Pain Score 05/19/15 2132 10     Pain Loc --      Pain Edu? --      Excl. in GC? --  Constitutional: Alert and oriented. Well appearing and in no acute distress. Eyes: Conjunctivae are normal. PERRL. EOMI. Head: Atraumatic. Nose: No congestion/rhinnorhea. Mouth/Throat: Mucous membranes are moist.  Oropharynx non-erythematous. Periodontal Exam    Neck: No stridor.  Hematological/Lymphatic/Immunilogical: No cervical lymphadenopathy. Cardiovascular:   Good peripheral circulation. Respiratory: Normal respiratory effort.  No retractions. Musculoskeletal: No lower extremity tenderness nor edema.  No joint effusions. Neurologic:  Normal speech and language. No gross focal neurologic deficits are appreciated. Speech is normal. No gait instability. Skin:  Skin is warm, dry and intact. No rash noted. Psychiatric: Mood and affect are normal. Speech and behavior are normal.  ____________________________________________   LABS (all labs ordered are listed, but only  abnormal results are displayed)  Labs Reviewed - No data to display ____________________________________________   RADIOLOGY  Not indicated ____________________________________________   PROCEDURES  Procedure(s) performed: None  Critical Care performed: No  ____________________________________________   INITIAL IMPRESSION / ASSESSMENT AND PLAN / ED COURSE  Pertinent labs & imaging results that were available during my care of the patient were reviewed by me and considered in my medical decision making (see chart for details).  Patient was advised to see the dentist within 14 days. Also advised to take the antibiotic until finished. Instructed to return to the ER for symptoms that change or worsen if you are unable to schedule an appointment. ____________________________________________   FINAL CLINICAL IMPRESSION(S) / ED DIAGNOSES  Final diagnoses:  Abscess, dental       Chinita PesterCari B Rehaan Viloria, FNP 05/19/15 2218  Emily FilbertJonathan E Williams, MD 05/19/15 2231

## 2015-05-19 NOTE — ED Notes (Signed)
Pt verbalized understanding of discharge instructions.

## 2015-06-27 ENCOUNTER — Inpatient Hospital Stay
Admission: EM | Admit: 2015-06-27 | Discharge: 2015-06-29 | DRG: 918 | Disposition: A | Discharge status: Home / Self Care | Payer: Self-pay | Attending: Internal Medicine | Admitting: Internal Medicine

## 2015-06-27 DIAGNOSIS — Z807 Family history of other malignant neoplasms of lymphoid, hematopoietic and related tissues: Secondary | ICD-10-CM

## 2015-06-27 DIAGNOSIS — K2970 Gastritis, unspecified, without bleeding: Secondary | ICD-10-CM | POA: Diagnosis present

## 2015-06-27 DIAGNOSIS — Z79899 Other long term (current) drug therapy: Secondary | ICD-10-CM

## 2015-06-27 DIAGNOSIS — K047 Periapical abscess without sinus: Secondary | ICD-10-CM | POA: Diagnosis present

## 2015-06-27 DIAGNOSIS — E876 Hypokalemia: Secondary | ICD-10-CM | POA: Diagnosis present

## 2015-06-27 DIAGNOSIS — K029 Dental caries, unspecified: Secondary | ICD-10-CM | POA: Diagnosis present

## 2015-06-27 DIAGNOSIS — Z87891 Personal history of nicotine dependence: Secondary | ICD-10-CM

## 2015-06-27 DIAGNOSIS — Z825 Family history of asthma and other chronic lower respiratory diseases: Secondary | ICD-10-CM

## 2015-06-27 DIAGNOSIS — R51 Headache: Secondary | ICD-10-CM | POA: Diagnosis present

## 2015-06-27 DIAGNOSIS — Z833 Family history of diabetes mellitus: Secondary | ICD-10-CM

## 2015-06-27 DIAGNOSIS — D509 Iron deficiency anemia, unspecified: Secondary | ICD-10-CM | POA: Diagnosis present

## 2015-06-27 DIAGNOSIS — Z9889 Other specified postprocedural states: Secondary | ICD-10-CM

## 2015-06-27 DIAGNOSIS — T391X1A Poisoning by 4-Aminophenol derivatives, accidental (unintentional), initial encounter: Principal | ICD-10-CM | POA: Diagnosis present

## 2015-06-27 LAB — COMPREHENSIVE METABOLIC PANEL
ALBUMIN: 3.5 g/dL (ref 3.5–5.0)
ALT: 11 U/L — AB (ref 14–54)
AST: 15 U/L (ref 15–41)
Alkaline Phosphatase: 66 U/L (ref 38–126)
Anion gap: 5 (ref 5–15)
BUN: 5 mg/dL — ABNORMAL LOW (ref 6–20)
CHLORIDE: 106 mmol/L (ref 101–111)
CO2: 25 mmol/L (ref 22–32)
CREATININE: 0.78 mg/dL (ref 0.44–1.00)
Calcium: 8.7 mg/dL — ABNORMAL LOW (ref 8.9–10.3)
GFR calc Af Amer: >60 mL/min (ref >60)
Glucose, Bld: 96 mg/dL (ref 65–99)
POTASSIUM: 3.9 mmol/L (ref 3.5–5.1)
SODIUM: 136 mmol/L (ref 135–145)
Total Bilirubin: 0.4 mg/dL (ref 0.3–1.2)
Total Protein: 8.2 g/dL — ABNORMAL HIGH (ref 6.5–8.1)

## 2015-06-27 LAB — CBC WITH DIFFERENTIAL/PLATELET
BASOS ABS: 0 10*3/uL (ref 0–0.1)
BASOS PCT: 1 %
EOS PCT: 1 %
Eosinophils Absolute: 0 10*3/uL (ref 0–0.7)
HCT: 32.4 % — ABNORMAL LOW (ref 35.0–47.0)
Hemoglobin: 10.1 g/dL — ABNORMAL LOW (ref 12.0–16.0)
LYMPHS PCT: 29 %
Lymphs Abs: 1.7 10*3/uL (ref 1.0–3.6)
MCH: 20.2 pg — ABNORMAL LOW (ref 26.0–34.0)
MCHC: 31.1 g/dL — ABNORMAL LOW (ref 32.0–36.0)
MCV: 64.9 fL — ABNORMAL LOW (ref 80.0–100.0)
MONO ABS: 0.6 10*3/uL (ref 0.2–0.9)
Monocytes Relative: 9 %
Neutro Abs: 3.6 10*3/uL (ref 1.4–6.5)
Neutrophils Relative %: 60 %
PLATELETS: 313 10*3/uL (ref 150–440)
RBC: 4.99 MIL/uL (ref 3.80–5.20)
RDW: 19 % — AB (ref 11.5–14.5)
WBC: 6 10*3/uL (ref 3.6–11.0)

## 2015-06-27 LAB — URINALYSIS COMPLETE WITH MICROSCOPIC (ARMC ONLY)
BILIRUBIN URINE: NEGATIVE
Bacteria, UA: NONE SEEN
Glucose, UA: NEGATIVE mg/dL
Hgb urine dipstick: NEGATIVE
Leukocytes, UA: NEGATIVE
Nitrite: NEGATIVE
PH: 5 (ref 5.0–8.0)
PROTEIN: NEGATIVE mg/dL
Specific Gravity, Urine: 1.045 — ABNORMAL HIGH (ref 1.005–1.030)

## 2015-06-27 LAB — SALICYLATE LEVEL: Salicylate Lvl: <4 mg/dL (ref 2.8–30.0)

## 2015-06-27 LAB — URINE DRUG SCREEN, QUALITATIVE (ARMC ONLY)
AMPHETAMINES, UR SCREEN: NOT DETECTED
BENZODIAZEPINE, UR SCRN: NOT DETECTED
Barbiturates, Ur Screen: NOT DETECTED
Cannabinoid 50 Ng, Ur ~~LOC~~: NOT DETECTED
Cocaine Metabolite,Ur ~~LOC~~: NOT DETECTED
MDMA (Ecstasy)Ur Screen: NOT DETECTED
Methadone Scn, Ur: NOT DETECTED
OPIATE, UR SCREEN: NOT DETECTED
PHENCYCLIDINE (PCP) UR S: NOT DETECTED
Tricyclic, Ur Screen: NOT DETECTED

## 2015-06-27 LAB — PROTIME-INR
INR: 1.15
PROTHROMBIN TIME: 14.9 s (ref 11.4–15.0)

## 2015-06-27 LAB — POCT PREGNANCY, URINE: PREG TEST UR: NEGATIVE

## 2015-06-27 LAB — ACETAMINOPHEN LEVEL: Acetaminophen (Tylenol), Serum: 42 ug/mL — ABNORMAL HIGH (ref 10–30)

## 2015-06-27 MED ORDER — DIPHENHYDRAMINE HCL 50 MG/ML IJ SOLN
INTRAMUSCULAR | Status: AC
Start: 2015-06-27 — End: 2015-06-27
  Administered 2015-06-27: 25 mg via INTRAVENOUS
  Filled 2015-06-27: qty 1

## 2015-06-27 MED ORDER — ACETAMINOPHEN 500 MG PO TABS: 1000.0000 mg | ORAL_TABLET | Freq: Once | ORAL | Status: DC

## 2015-06-27 MED ORDER — DIAZEPAM 5 MG/ML IJ SOLN
5.0000 mg | Freq: Once | INTRAMUSCULAR | Status: AC
  Administered 2015-06-27: 5 mg via INTRAVENOUS
  Filled 2015-06-27: qty 2

## 2015-06-27 MED ORDER — ACETYLCYSTEINE 200 MG/ML IV SOLN
15.0000 mg/kg/h | INTRAVENOUS | Status: AC
  Administered 2015-06-27 – 2015-06-28 (×3): 15 mg/kg/h via INTRAVENOUS
  Filled 2015-06-27 (×4): qty 200

## 2015-06-27 MED ORDER — PANTOPRAZOLE SODIUM 40 MG IV SOLR
40.0000 mg | Freq: Two times a day (BID) | INTRAVENOUS | Status: DC
  Administered 2015-06-27 – 2015-06-28 (×2): 40 mg via INTRAVENOUS
  Filled 2015-06-27 (×2): qty 40

## 2015-06-27 MED ORDER — DIPHENHYDRAMINE HCL 50 MG/ML IJ SOLN
INTRAMUSCULAR | Status: AC
  Filled 2015-06-27: qty 1

## 2015-06-27 MED ORDER — ONDANSETRON HCL 4 MG/2ML IJ SOLN
4.0000 mg | Freq: Once | INTRAMUSCULAR | Status: AC
  Administered 2015-06-27: 4 mg via INTRAVENOUS
  Filled 2015-06-27: qty 2

## 2015-06-27 MED ORDER — ACETYLCYSTEINE LOAD VIA INFUSION
150.0000 mg/kg | Freq: Once | INTRAVENOUS | Status: DC
Start: 2015-06-27 — End: 2015-06-27
  Filled 2015-06-27 (×2): qty 528

## 2015-06-27 MED ORDER — DIPHENHYDRAMINE HCL 50 MG/ML IJ SOLN
25.0000 mg | Freq: Once | INTRAMUSCULAR | Status: AC
  Administered 2015-06-27: 25 mg via INTRAVENOUS

## 2015-06-27 MED ORDER — DIPHENHYDRAMINE HCL 50 MG/ML IJ SOLN
12.5000 mg | Freq: Four times a day (QID) | INTRAMUSCULAR | Status: DC | PRN
  Administered 2015-06-28: 21:00:00 12.5 mg via INTRAVENOUS
  Filled 2015-06-27: qty 1

## 2015-06-27 MED ORDER — ACETYLCYSTEINE LOAD VIA INFUSION
150.0000 mg/kg | Freq: Once | INTRAVENOUS | Status: AC
  Administered 2015-06-27: 21090 mg via INTRAVENOUS
  Filled 2015-06-27: qty 528

## 2015-06-27 MED ORDER — SODIUM CHLORIDE 0.9 % IV SOLN
Freq: Once | INTRAVENOUS | Status: AC
  Administered 2015-06-27: 19:00:00 via INTRAVENOUS

## 2015-06-27 MED ORDER — SODIUM CHLORIDE 0.9 % IV SOLN
INTRAVENOUS | Status: DC
  Administered 2015-06-27 – 2015-06-28 (×3): via INTRAVENOUS

## 2015-06-27 NOTE — ED Notes (Signed)
Pt arrived via EMS c/o toothache and took an excessive amount of Tylenol (bottle of 100, only 26 left). BP 190/100, HR 74, CBG 93, and O2 100%.

## 2015-06-27 NOTE — ED Provider Notes (Signed)
[facility] Emergency Department Provider Note     Time seen: ----------------------------------------- 4:55 PM on 06/27/2015 -----------------------------------------    I have reviewed the triage vital signs and the nursing notes.   HISTORY  Chief Complaint No chief complaint on file.    HPI Linda Sawyer is a 25 y.o. female brought the ER by EMS after a Tylenol overdose. Patient states she's been taking Tylenol but a handful to help her toothache that she's had for several months. Patient states she's not sure how much she took, the bottle was full when she started taking Tylenol and now there is approximately 25 left of the 500 mg Tylenol and a bottle of 100 tablets. Patient started to have some abdominal pain and nausea, toothache is in the right upper jaw posteriorly.States she was not trying to hurt herself, was just trying to alleviate the dental pain   No past medical history on file.  There are no active problems to display for this patient.   Past Surgical History  Procedure Laterality Date  . Hand surgery      Allergies Review of patient's allergies indicates no known allergies.  Social History Social History  Substance Use Topics  . Smoking status: Former Smoker    Quit date: 03/01/2015  . Smokeless tobacco: Not on file  . Alcohol Use: No    Review of Systems Constitutional: Negative for fever. Eyes: Negative for visual changes. ENT: Negative for sore throat. Positive for toothache Cardiovascular: Negative for chest pain. Respiratory: Negative for shortness of breath. Gastrointestinal: Positive for abdominal pain and vomiting Genitourinary: Negative for dysuria. Musculoskeletal: Negative for back pain. Skin: Negative for rash. Neurological: Negative for headaches, focal weakness or numbness.  10-point ROS otherwise negative.  ____________________________________________   PHYSICAL EXAM:  VITAL SIGNS: ED Triage  Vitals  Enc Vitals Group     BP --      Pulse --      Resp --      Temp --      Temp src --      SpO2 --      Weight --      Height --      Head Cir --      Peak Flow --      Pain Score --      Pain Loc --      Pain Edu? --      Excl. in GC? --     Constitutional: Alert and oriented. Well appearing and in no distress. Eyes: Conjunctivae are normal. PERRL. Normal extraocular movements. ENT   Head: Normocephalic and atraumatic.   Nose: No congestion/rhinnorhea.   Mouth/Throat: Mucous membranes are moist. Dental caries are present with no obvious abscess formation in the right upper jaw posteriorly   Neck: No stridor. Cardiovascular: Normal rate, regular rhythm. Normal and symmetric distal pulses are present in all extremities. No murmurs, rubs, or gallops. Respiratory: Normal respiratory effort without tachypnea nor retractions. Breath sounds are clear and equal bilaterally. No wheezes/rales/rhonchi. Gastrointestinal: Soft and nontender. No distention. No abdominal bruits.  Musculoskeletal: Nontender with normal range of motion in all extremities. No joint effusions.  No lower extremity tenderness nor edema. Neurologic:  Normal speech and language. No gross focal neurologic deficits are appreciated. Speech is normal.  Skin:  Skin is warm, dry and intact. No rash noted. Psychiatric: Mood and affect are normal. Speech and behavior are normal. Patient exhibits appropriate insight and judgment. patient denies suicidal or homicidal ideation  ____________________________________________  EKG: Interpreted by me. Normal sinus rhythm with a rate of 59 bpm, normal PR interval, normal QRS, normal QT interval. Normal axis. No evidence of acute infarction.  ____________________________________________  ED COURSE:  Pertinent labs & imaging results that were available during my care of the patient were reviewed by me and considered in my medical decision making (see chart for  details). patient with a Tylenol overdose, we'll assess with lab work and discussed with poison control.  Case is discussed with poison control who recommends initial loading dose of Acetadote ____________________________________________   CRITICAL CARE Performed by: Emily Filbert   Total critical care time: 30 minutes  Critical care time was exclusive of separately billable procedures and treating other patients.  Critical care was necessary to treat or prevent imminent or life-threatening deterioration.  Critical care was time spent personally by me on the following activities: development of treatment plan with patient and/or surrogate as well as nursing, discussions with consultants, evaluation of patient's response to treatment, examination of patient, obtaining history from patient or surrogate, ordering and performing treatments and interventions, ordering and review of laboratory studies, ordering and review of radiographic studies, pulse oximetry and re-evaluation of patient's condition.   LABS (pertinent positives/negatives)  Labs Reviewed  CBC WITH DIFFERENTIAL/PLATELET - Abnormal; Notable for the following:    Hemoglobin 10.1 (*)    HCT 32.4 (*)    MCV 64.9 (*)    MCH 20.2 (*)    MCHC 31.1 (*)    RDW 19.0 (*)    All other components within normal limits  COMPREHENSIVE METABOLIC PANEL - Abnormal; Notable for the following:    BUN 5 (*)    Calcium 8.7 (*)    Total Protein 8.2 (*)    ALT 11 (*)    All other components within normal limits  ACETAMINOPHEN LEVEL - Abnormal; Notable for the following:    Acetaminophen (Tylenol), Serum 42 (*)    All other components within normal limits  PROTIME-INR  SALICYLATE LEVEL  URINE DRUG SCREEN, QUALITATIVE ([facility] ONLY)  URINALYSIS COMPLETEWITH MICROSCOPIC ([facility] ONLY)  POC URINE PREG, ED   CRITICAL CARE Performed by: Emily Filbert   Total critical care time: 30 minutes  Critical care time was exclusive of  separately billable procedures and treating other patients.  Critical care was necessary to treat or prevent imminent or life-threatening deterioration.  Critical care was time spent personally by me on the following activities: development of treatment plan with patient and/or surrogate as well as nursing, discussions with consultants, evaluation of patient's response to treatment, examination of patient, obtaining history from patient or surrogate, ordering and performing treatments and interventions, ordering and review of laboratory studies, ordering and review of radiographic studies, pulse oximetry and re-evaluation of patient's condition.  ____________________________________________  FINAL ASSESSMENT AND PLAN  Tylenol overdose  Plan: Patient with labs and imaging as dictated above. Surprisingly the initial Tylenol level does not meet treatment threshold. She's been started on Acetadote anyway. Poison control recommends admission for 24 hours and a treatment with Acetadote with recheck of her liver function test tomorrow. Patient agrees to plan.   Emily Filbert, MD   Emily Filbert, MD 06/27/15 2012

## 2015-06-27 NOTE — Progress Notes (Signed)
MEDICATION RELATED CONSULT NOTE - INITIAL   Pharmacy Consult for acetylcysteine  Indication: APAP overdose  No Known Allergies  Patient Measurements: Height: 5\' 10"  (177.8 cm) Weight: (!) 310 lb (140.615 kg) IBW/kg (Calculated) : 68.5   Estimated Creatinine Clearance: 165.1 mL/min (by C-G formula based on Cr of 0.78).  Assessment: Patient presented to the ED with an unintentional APAP overdose 2/2 tooth pain. Discussed case with poison control and confirmed loading dose of 21.090 mg infused over one hour followed by 15 mg/kg/hr (52.7 mL/hr infusion rate). Poison control recommends that APAP level and hepatic function tests be checked around the 20th hour of infusion to determine if treatment should be continued longer than 24 hours.    Infusion began ~1900 on 1/15 Baseline APAP level elevated at 42 mcg/mL  Plan:  Pharmacy will continue to follow. Continue current infusion rate of 2109 mg/hr.   Linda Sawyer 06/27/2015,10:02 PM

## 2015-06-27 NOTE — ED Notes (Signed)
Father and grandmother in with pt. Iv fluids infusing.  nsr on monitor.  Randall mullins-father-[phone].  Grandmother-sandra watkins- [phone].

## 2015-06-27 NOTE — H&P (Signed)
[facility]  at [facility]   PATIENT NAME: Linda Sawyer    MR#:  [id number]  DATE OF BIRTH:  1990-07-19  DATE OF ADMISSION:  06/27/2015  PRIMARY CARE PHYSICIAN: No PCP Per Patient   REQUESTING/REFERRING PHYSICIAN: Williams  CHIEF COMPLAINT:   Chief Complaint  Patient presents with  . Drug Overdose    HISTORY OF PRESENT ILLNESS: Linda Sawyer  is a 25 y.o. female with a known history of no any medical issues, has 2008 for last few days and does not have insurance to go to the dentist so she started taking Tylenol. She does almost 60-70 tablets of Tylenol but did not get enough pain relief and started having nausea and shaking today so came to emergency room. She denies any suicidal intention. Her liver function test was still within normal limit but [facility] started on N-acetylcysteine and give for admission to hospitalist team. Her Tylenol level is elevated in the blood. When I went to see the patient just a few minutes ago she received an injection of Valium as dilated by ER physician and patient was having an acute allergic reaction to that itching all over her body and tachypnea, after receiving Benadryl injection she calmed down and became slightly drowsy. During this allergic reaction episode her blood pressure was under control her oxygen level was slightly dropping To high 80s so I started on nasal cannula supplemental oxygen and it came up 100%  PAST MEDICAL HISTORY:  History reviewed. No pertinent past medical history.  PAST SURGICAL HISTORY:  Past Surgical History  Procedure Laterality Date  . Hand surgery      SOCIAL HISTORY:  Social History  Substance Use Topics  . Smoking status: Former Smoker    Quit date: 03/01/2015  . Smokeless tobacco: Not on file  . Alcohol Use: No    FAMILY HISTORY:  Family History  Problem Relation Age of Onset  . Diabetes Mother   . Asthma Brother   . Hodgkin's  lymphoma Maternal Aunt     DRUG ALLERGIES: No Known Allergies  REVIEW OF SYSTEMS:   CONSTITUTIONAL: No fever, fatigue or weakness.  EYES: No blurred or double vision.  EARS, NOSE, AND THROAT: No tinnitus or ear pain.  RESPIRATORY: No cough, shortness of breath, wheezing or hemoptysis.  CARDIOVASCULAR: No chest pain, orthopnea, edema.  GASTROINTESTINAL: positive for nausea, no vomiting, diarrhea or abdominal pain.  GENITOURINARY: No dysuria, hematuria.  ENDOCRINE: No polyuria, nocturia,  HEMATOLOGY: No anemia, easy bruising or bleeding SKIN: No rash or lesion. MUSCULOSKELETAL: No joint pain or arthritis.   NEUROLOGIC: No tingling, numbness, weakness.  PSYCHIATRY: No anxiety or depression.   MEDICATIONS AT HOME:  Prior to Admission medications   Medication Sig Start Date End Date Taking? Authorizing Provider  amoxicillin (AMOXIL) 500 MG tablet Take 1 tablet (500 mg total) by mouth 3 (three) times daily. 05/19/15   Chinita Pesterari B Triplett, FNP  HYDROcodone-acetaminophen (NORCO/VICODIN) 5-325 MG tablet Take 1 tablet by mouth every 4 (four) hours as needed for moderate pain. 05/19/15   Chinita Pesterari B Triplett, FNP  ibuprofen (ADVIL,MOTRIN) 800 MG tablet Take 1 tablet (800 mg total) by mouth every 8 (eight) hours as needed. 05/19/15   Chinita Pesterari B Triplett, FNP  naproxen (NAPROSYN) 500 MG tablet Take 1 tablet (500 mg total) by mouth 2 (two) times daily with a meal. 03/25/15 03/24/16  Chinita Pesterari B Triplett, FNP  sulfamethoxazole-trimethoprim (BACTRIM DS,SEPTRA DS) 800-160 MG tablet Take 1  tablet by mouth 2 (two) times daily. X 7 days 04/09/15   Evon Slack, PA-C  traMADol (ULTRAM) 50 MG tablet Take 1 tablet (50 mg total) by mouth every 6 (six) hours as needed. 03/25/15   Chinita Pester, FNP      PHYSICAL EXAMINATION:   VITAL SIGNS: Blood pressure 144/79, pulse 86, temperature 98.4 F (36.9 C), temperature source Oral, resp. rate 14, height 5\' 10"  (1.778 m), weight 140.615 kg (310 lb), last menstrual period  06/24/2015, SpO2 100 %.  GENERAL:  25 y.o.-year-old patient lying in the bed with no acute distress.  EYES: Pupils equal, round, reactive to light and accommodation. No scleral icterus. Extraocular muscles intact.  HEENT: Head atraumatic, normocephalic. Oropharynx and nasopharynx clear.  NECK:  Supple, no jugular venous distention. No thyroid enlargement, no tenderness.  LUNGS: Normal breath sounds bilaterally, no wheezing, rales,rhonchi or crepitation. No use of accessory muscles of respiration.  CARDIOVASCULAR: S1, S2 normal. No murmurs, rubs, or gallops.  ABDOMEN: Soft, nontender, nondistended. Bowel sounds present. No organomegaly or mass.  EXTREMITIES: No pedal edema, cyanosis, or clubbing.  NEUROLOGIC: Cranial nerves II through XII are intact. Muscle strength 5/5 in all extremities. Sensation intact. Gait not checked.  PSYCHIATRIC: The patient is alert and oriented x 3.  SKIN: No obvious rash, lesion, or ulcer.   During the Acute allergic reaction episode when I saw the patient she was in distress with tachypnea, tachycardia, and severe itching all over the body but after getting Benadryl injection within few minutes she is car and slightly drowsy.  LABORATORY PANEL:   CBC  Recent Labs Lab 06/27/15 1747  WBC 6.0  HGB 10.1*  HCT 32.4*  PLT 313  MCV 64.9*  MCH 20.2*  MCHC 31.1*  RDW 19.0*  LYMPHSABS 1.7  MONOABS 0.6  EOSABS 0.0  BASOSABS 0.0   ------------------------------------------------------------------------------------------------------------------  Chemistries   Recent Labs Lab 06/27/15 1747  NA 136  K 3.9  CL 106  CO2 25  GLUCOSE 96  BUN 5*  CREATININE 0.78  CALCIUM 8.7*  AST 15  ALT 11*  ALKPHOS 66  BILITOT 0.4   ------------------------------------------------------------------------------------------------------------------ estimated creatinine clearance is 165.1 mL/min (by C-G formula based on Cr of  0.78). ------------------------------------------------------------------------------------------------------------------ No results for input(s): TSH, T4TOTAL, T3FREE, THYROIDAB in the last 72 hours.  Invalid input(s): FREET3   Coagulation profile  Recent Labs Lab 06/27/15 1747  INR 1.15   ------------------------------------------------------------------------------------------------------------------- No results for input(s): DDIMER in the last 72 hours. -------------------------------------------------------------------------------------------------------------------  Cardiac Enzymes No results for input(s): CKMB, TROPONINI, MYOGLOBIN in the last 168 hours.  Invalid input(s): CK ------------------------------------------------------------------------------------------------------------------ Invalid input(s): POCBNP  ---------------------------------------------------------------------------------------------------------------  Urinalysis    Component Value Date/Time   COLORURINE YELLOW* 06/27/2015 1747   COLORURINE Yellow 07/11/2014 1300   APPEARANCEUR CLEAR* 06/27/2015 1747   APPEARANCEUR Cloudy 07/11/2014 1300   LABSPEC 1.045* 06/27/2015 1747   LABSPEC 1.025 07/11/2014 1300   PHURINE 5.0 06/27/2015 1747   PHURINE 6.0 07/11/2014 1300   GLUCOSEU NEGATIVE 06/27/2015 1747   GLUCOSEU Negative 07/11/2014 1300   HGBUR NEGATIVE 06/27/2015 1747   HGBUR 3+ 07/11/2014 1300   BILIRUBINUR NEGATIVE 06/27/2015 1747   BILIRUBINUR Negative 07/11/2014 1300   KETONESUR 1+* 06/27/2015 1747   KETONESUR Negative 07/11/2014 1300   PROTEINUR NEGATIVE 06/27/2015 1747   PROTEINUR 100 mg/dL 16/03/9603 5409   NITRITE NEGATIVE 06/27/2015 1747   NITRITE Negative 07/11/2014 1300   LEUKOCYTESUR NEGATIVE 06/27/2015 1747   LEUKOCYTESUR Trace 07/11/2014 1300     RADIOLOGY: No results  found.   IMPRESSION AND PLAN:  *Acetaminophen overdose  This is unintentional and no suicidal  ideation.  Liver function test are within normal limits until now.  N-acetylcysteine is started as dilated by [employer].  Follow your function test tomorrow.  * Allergy reaction to Valium  Responded nicely to injection Benadryl.  Watch for any further reactions.  * Gastritis with nausea  Likely due to acetaminophen overdose  We'll give some Protonix IV. We'll keep her nothing by mouth.  * Tooth pain  Once she is more stable we might have to start on some oral antibiotic and arrange for her follow-ups with the dentist.  All the records are reviewed and case discussed with ED provider. Management plans discussed with the patient, family and they are in agreement.  CODE STATUS: Full code  Full code Code Status History    This patient does not have a recorded code status. Please follow your organizational policy for patients in this situation.      TOTAL TIME TAKING CARE OF THIS PATIENT: 50 critical care  minutes.  Patient's mother and sister are present in the room I explained about the critical situation and will monitor in stepdown unit overnight.  Altamese Dilling M.D on 06/27/2015   Between 7am to 6pm - Pager - [phone]  After 6pm go to [email or web address] - password [email or web address] [facility]  [facility]  Office  [phone]  CC: Primary care physician; No PCP Per Patient   Note: This dictation was prepared with Dragon dictation along with smaller phrase technology. Any transcriptional errors that result from this process are unintentional.

## 2015-06-27 NOTE — ED Notes (Signed)
Pt has allergic reaction to valium; visibly uncomfortable reports feeling itchy, red rash on her neck and face, breathing excessively. MD at bedside.

## 2015-06-27 NOTE — ED Notes (Signed)
Pt reports itching and nausea.  meds given iv.

## 2015-06-27 NOTE — ED Notes (Signed)
Pt sleeping  Itching and nausea improved.  nsr on monitor.

## 2015-06-28 LAB — AST: AST: 14 U/L — AB (ref 15–41)

## 2015-06-28 LAB — BASIC METABOLIC PANEL
Anion gap: 7 (ref 5–15)
CALCIUM: 8.5 mg/dL — AB (ref 8.9–10.3)
CO2: 22 mmol/L (ref 22–32)
Chloride: 107 mmol/L (ref 101–111)
Creatinine, Ser: 0.65 mg/dL (ref 0.44–1.00)
GFR calc Af Amer: >60 mL/min (ref >60)
GLUCOSE: 100 mg/dL — AB (ref 65–99)
Potassium: 3.3 mmol/L — ABNORMAL LOW (ref 3.5–5.1)
Sodium: 136 mmol/L (ref 135–145)

## 2015-06-28 LAB — CBC
HCT: 31.8 % — ABNORMAL LOW (ref 35.0–47.0)
Hemoglobin: 10 g/dL — ABNORMAL LOW (ref 12.0–16.0)
MCH: 20.1 pg — ABNORMAL LOW (ref 26.0–34.0)
MCHC: 31.5 g/dL — AB (ref 32.0–36.0)
MCV: 63.7 fL — ABNORMAL LOW (ref 80.0–100.0)
Platelets: 300 10*3/uL (ref 150–440)
RBC: 4.99 MIL/uL (ref 3.80–5.20)
RDW: 18.5 % — AB (ref 11.5–14.5)
WBC: 8.7 10*3/uL (ref 3.6–11.0)

## 2015-06-28 LAB — HEPATIC FUNCTION PANEL
ALK PHOS: 51 U/L (ref 38–126)
ALT: 14 U/L (ref 14–54)
AST: 16 U/L (ref 15–41)
Albumin: 3.2 g/dL — ABNORMAL LOW (ref 3.5–5.0)
BILIRUBIN TOTAL: 0.2 mg/dL — AB (ref 0.3–1.2)
Bilirubin, Direct: <0.1 mg/dL — ABNORMAL LOW (ref 0.1–0.5)
TOTAL PROTEIN: 7.8 g/dL (ref 6.5–8.1)

## 2015-06-28 LAB — MRSA PCR SCREENING: MRSA BY PCR: NEGATIVE

## 2015-06-28 LAB — PROTIME-INR
INR: 1.29
PROTHROMBIN TIME: 16.2 s — AB (ref 11.4–15.0)

## 2015-06-28 LAB — ACETAMINOPHEN LEVEL

## 2015-06-28 LAB — ALT: ALT: 13 U/L — AB (ref 14–54)

## 2015-06-28 LAB — GLUCOSE, CAPILLARY: GLUCOSE-CAPILLARY: 120 mg/dL — AB (ref 65–99)

## 2015-06-28 MED ORDER — IBUPROFEN 400 MG PO TABS: 400.0000 mg | ORAL_TABLET | Freq: Four times a day (QID) | ORAL | Status: DC | PRN

## 2015-06-28 MED ORDER — POTASSIUM CHLORIDE CRYS ER 20 MEQ PO TBCR
40.0000 meq | EXTENDED_RELEASE_TABLET | Freq: Once | ORAL | Status: AC
  Administered 2015-06-28: 40 meq via ORAL
  Filled 2015-06-28: qty 2

## 2015-06-28 MED ORDER — CLINDAMYCIN HCL 150 MG PO CAPS
300.0000 mg | ORAL_CAPSULE | Freq: Three times a day (TID) | ORAL | Status: DC
  Administered 2015-06-28: 300 mg via ORAL
  Filled 2015-06-28 (×4): qty 2

## 2015-06-28 MED ORDER — CLINDAMYCIN PHOSPHATE 300 MG/50ML IV SOLN
300.0000 mg | Freq: Three times a day (TID) | INTRAVENOUS | Status: DC
  Administered 2015-06-28 – 2015-06-29 (×2): 300 mg via INTRAVENOUS
  Filled 2015-06-28 (×4): qty 50

## 2015-06-28 MED ORDER — OXYCODONE HCL 5 MG PO TABS
5.0000 mg | ORAL_TABLET | Freq: Four times a day (QID) | ORAL | Status: DC | PRN
  Administered 2015-06-28: 5 mg via ORAL
  Filled 2015-06-28 (×2): qty 1

## 2015-06-28 MED ORDER — INFLUENZA VAC SPLIT QUAD 0.5 ML IM SUSY
0.5000 mL | PREFILLED_SYRINGE | INTRAMUSCULAR | Status: AC
  Administered 2015-06-29: 09:00:00 0.5 mL via INTRAMUSCULAR
  Filled 2015-06-28: qty 0.5

## 2015-06-28 MED ORDER — MORPHINE SULFATE (PF) 4 MG/ML IV SOLN
4.0000 mg | INTRAVENOUS | Status: DC | PRN
  Administered 2015-06-28 (×3): 4 mg via INTRAVENOUS
  Filled 2015-06-28 (×3): qty 1

## 2015-06-28 MED ORDER — ONDANSETRON HCL 4 MG/2ML IJ SOLN
4.0000 mg | INTRAMUSCULAR | Status: DC | PRN
Start: 2015-06-28 — End: 2015-06-29
  Administered 2015-06-28: 4 mg via INTRAVENOUS
  Filled 2015-06-28: qty 2

## 2015-06-28 MED ORDER — MORPHINE SULFATE (PF) 4 MG/ML IV SOLN
4.0000 mg | Freq: Once | INTRAVENOUS | Status: AC
  Administered 2015-06-28: 4 mg via INTRAVENOUS
  Filled 2015-06-28: qty 1

## 2015-06-28 NOTE — Progress Notes (Signed)
Pt complaining of nause and doubled vision. Pt appears to be a little more drowsy than previous assessment. Dr. Clint GuyHower notified. New orders for PRN ondansetron given. Will continue to monitor.

## 2015-06-28 NOTE — Progress Notes (Signed)
Patient ID: Linda Sawyer, female   DOB: Jan 31, 1991, 25 y.o.   MRN: [id number] [facility] PROGRESS NOTE  PCP: No PCP Per Patient  HPI/Subjective: Patient had some abdominal pain earlier. She has been taking Tylenol for her toothache. She's been taking 500 mg Tylenol handful's at a time every 3 hours. She currently feels better today than she did yesterday.  Objective: Filed Vitals:   06/28/15 1200 06/28/15 1442  BP: 135/99 133/78  Pulse: 90 86  Temp: 98.7 F (37.1 C) 98 F (36.7 C)  Resp: 20 20    Filed Weights   06/27/15 1700  Weight: 140.615 kg (310 lb)    ROS: Review of Systems  Constitutional: Negative for fever and chills.  Eyes: Negative for blurred vision.  Respiratory: Negative for cough and shortness of breath.   Cardiovascular: Negative for chest pain.  Gastrointestinal: Positive for abdominal pain. Negative for nausea, vomiting, diarrhea and constipation.  Genitourinary: Negative for dysuria.  Musculoskeletal: Negative for joint pain.  Neurological: Negative for dizziness and headaches.   Exam: Physical Exam  Constitutional: She is oriented to person, place, and time.  HENT:  Nose: No mucosal edema.  Mouth/Throat: No oropharyngeal exudate or posterior oropharyngeal edema.    Right upper two posterior teeth broken and looks infected and swollen.  Eyes: Conjunctivae, EOM and lids are normal. Pupils are equal, round, and reactive to light.  Neck: No JVD present. Carotid bruit is not present. No edema present. No thyroid mass and no thyromegaly present.  Cardiovascular: S1 normal and S2 normal.  Exam reveals no gallop.   No murmur heard. Pulses:      Dorsalis pedis pulses are 2+ on the right side, and 2+ on the left side.  Respiratory: No respiratory distress. She has no wheezes. She has no rhonchi. She has no rales.  GI: Soft. Bowel sounds are normal. There is no tenderness.  Musculoskeletal:       Right ankle: She exhibits swelling.   Left ankle: She exhibits swelling.  Lymphadenopathy:    She has no cervical adenopathy.  Neurological: She is alert and oriented to person, place, and time. No cranial nerve deficit.  Skin: Skin is warm. No rash noted. Nails show no clubbing.  Psychiatric: She has a normal mood and affect.      Data Reviewed: Basic Metabolic Panel:  Recent Labs Lab 06/27/15 1747 06/28/15 0506  NA 136 136  K 3.9 3.3*  CL 106 107  CO2 25 22  GLUCOSE 96 100*  BUN 5* <5*  CREATININE 0.78 0.65  CALCIUM 8.7* 8.5*   Liver Function Tests:  Recent Labs Lab 06/27/15 1747 06/28/15 0506  AST 15 16  ALT 11* 14  ALKPHOS 66 51  BILITOT 0.4 0.2*  PROT 8.2* 7.8  ALBUMIN 3.5 3.2*   CBC:  Recent Labs Lab 06/27/15 1747 06/28/15 0506  WBC 6.0 8.7  NEUTROABS 3.6  --   HGB 10.1* 10.0*  HCT 32.4* 31.8*  MCV 64.9* 63.7*  PLT 313 300    CBG:  Recent Labs Lab 06/28/15 0157  GLUCAP 120*    Recent Results (from the past 240 hour(s))  MRSA PCR Screening     Status: None   Collection Time: 06/28/15 12:45 AM  Result Value Ref Range Status   MRSA by PCR NEGATIVE NEGATIVE Final    Comment:        The GeneXpert MRSA Assay (FDA approved for NASAL specimens only), is one component of a comprehensive MRSA colonization  surveillance program. It is not intended to diagnose MRSA infection nor to guide or monitor treatment for MRSA infections.      Scheduled Meds: . clindamycin  300 mg Oral 3 times per day  . [START ON 06/29/2015] Influenza vac split quadrivalent PF  0.5 mL Intramuscular Tomorrow-1000  . potassium chloride  40 mEq Oral Once   Continuous Infusions: . sodium chloride 50 mL/hr at 06/28/15 1135  . acetylcysteine 15 mg/kg/hr (06/28/15 0716)    Assessment/Plan:  1. Tylenol overdose, unintentional. Patient was trying to dull pain in her mouth. No suicidal ideation or homicidal ideation. As per poison control, she is on acetylcysteine protocol. She will have 24 hours of this  and then pharmacy will discontinue. Labs will be repeated at 1600. 2. Dental caries and abscess- clindamycin. Oral oxycodone. Spoke with patient and mother that the patient will have to follow up at the dental clinic at [facility] 3. Hypokalemia- replacement ordered 4. Microcytic anemia- likely iron deficiency  Code Status:     Code Status Orders        Start     Ordered   06/28/15 0044  Full code   Continuous     06/28/15 0043    Code Status History    Date Active Date Inactive Code Status Order ID Comments User Context   This patient has a current code status but no historical code status.     Family Communication: Spoke with mother on phone Disposition Plan: Likely home tomorrow  Antibiotics:  Oral clindamycin  Time spent: 24 minutes  Alford Highland  [facility]

## 2015-06-28 NOTE — Progress Notes (Signed)
MEDICATION RELATED CONSULT NOTE - Follow Up  Pharmacy Consult for acetylcysteine  Indication: APAP overdose  Allergies  Allergen Reactions  . Valium [Diazepam] Itching and Rash    Patient Measurements: Height: 5\' 10"  (177.8 cm) Weight: (!) 310 lb (140.615 kg) IBW/kg (Calculated) : 68.5   Estimated Creatinine Clearance: 165.1 mL/min (by C-G formula based on Cr of 0.65).  Assessment: Patient presented to the ED with an unintentional APAP overdose 2/2 tooth pain. Discussed case with poison control and confirmed loading dose of 21.090 mg infused over one hour followed by 15 mg/kg/hr (52.7 mL/hr infusion rate). Poison control recommends that APAP level and hepatic function tests be checked around the 20th hour of infusion to determine if treatment should be continued longer than 24 hours.    Infusion began ~1900 on 1/15 Baseline APAP level elevated at 42 mcg/mL 01/16: APAP level= < 10 1/16 at 1600: ALT - 13, AST - 14, INR - 1.29  Plan:  LFTs and INR WNL.   Will discontinue acetylcysteine drip at 1900 to make a total 24 hours of therapy.  RN aware of stop date/time.    Aubrei Bouchie G 06/28/2015,5:05 PM

## 2015-06-28 NOTE — Progress Notes (Signed)
I spoke with Poison Control Center regarding POC for patient.  Their recommendations are to continue Mucomyst infusion until lab recheck at 1600 and d/c then if AST/ALT/INR are all WNL.  I relayed this information to Dr Nicholos Johnsamachandran and Dr Renae GlossWieting.

## 2015-06-28 NOTE — Progress Notes (Signed)
eLink Physician-Brief Progress Note Patient Name: Linda Sawyer DOB: 1990-07-11 MRN: [id number]   Date of Service  06/28/2015  HPI/Events of Note  New patient admitted with unintentional tylenol OD. Poison control contacted & recommended IV NAC. Patient had allergic reaction to Valium in ED w/ itching.  eICU Interventions  1. Continue current plan of care 2. Valium added to allergy list     Intervention Category Evaluation Type: New Patient Evaluation  [name] 06/28/2015, 2:36 AM

## 2015-06-28 NOTE — Progress Notes (Signed)
Pt complaining of severe abdominal pain. Pt says that it is like the pain she had on arrival to the ED but worse. Pt's respiratory rate and BP slightly elevated. She is afebrile. Dr. Sheryle Hailiamond paged and updated on Pt's condition. New orders for PRN morphine given. Will continue to monitor.

## 2015-06-28 NOTE — Progress Notes (Signed)
MEDICATION RELATED CONSULT NOTE - INITIAL   Pharmacy Consult for acetylcysteine  Indication: APAP overdose  Allergies  Allergen Reactions  . Valium [Diazepam] Itching and Rash    Patient Measurements: Height: 5\' 10"  (177.8 cm) Weight: (!) 310 lb (140.615 kg) IBW/kg (Calculated) : 68.5   Estimated Creatinine Clearance: 165.1 mL/min (by C-G formula based on Cr of 0.65).  Assessment: Patient presented to the ED with an unintentional APAP overdose 2/2 tooth pain. Discussed case with poison control and confirmed loading dose of 21.090 mg infused over one hour followed by 15 mg/kg/hr (52.7 mL/hr infusion rate). Poison control recommends that APAP level and hepatic function tests be checked around the 20th hour of infusion to determine if treatment should be continued longer than 24 hours.    Infusion began ~1900 on 1/15 Baseline APAP level elevated at 42 mcg/mL 01/19: APAP level= < 10  Plan:  APAP level good this AM. LFTs also wnl. Spoke to poison control who suggest continuing infusion until after next labs are due at 1600. If LFTs and INR are normal, can stop infusion at that time.   Luisa Harthristy, Sevannah Madia D 06/28/2015,12:31 PM

## 2015-06-29 MED ORDER — OXYCODONE HCL 5 MG PO TABS: 5.0000 mg | ORAL_TABLET | Freq: Four times a day (QID) | ORAL | Status: DC | PRN

## 2015-06-29 MED ORDER — CLINDAMYCIN HCL 300 MG PO CAPS: 300.0000 mg | ORAL_CAPSULE | Freq: Three times a day (TID) | ORAL | Status: DC

## 2015-06-29 NOTE — Plan of Care (Signed)
Problem: Pain Managment: Goal: General experience of comfort will improve Outcome: Completed/Met Date Met:  06/29/15 Denies pain this morning  Problem: Physical Regulation: Goal: Will remain free from infection Outcome: Progressing Will DC with PO abx

## 2015-06-29 NOTE — Discharge Instructions (Signed)
Heart healthy diet. °Activity as tolerated. °

## 2015-06-29 NOTE — Discharge Summary (Addendum)
[facility] - [place] at [facility] [place]   PATIENT NAME: Linda Sawyer    MR#:  [id number]  DATE OF BIRTH:  [birth year]  DATE OF ADMISSION:  06/27/2015 ADMITTING PHYSICIAN: Altamese Dilling, MD  DATE OF DISCHARGE: 06/29/2015  1:00 PM  PRIMARY CARE PHYSICIAN: No PCP Per Patient    ADMISSION DIAGNOSIS:  Tylenol overdose, accidental or unintentional, initial encounter [T39.1X1A]   DISCHARGE DIAGNOSIS:   Tylenol overdose, unintentional. SECONDARY DIAGNOSIS:  History reviewed. No pertinent past medical history.  HOSPITAL COURSE:   1. Tylenol overdose, unintentional. Patient was trying to dull pain in her mouth. No suicidal ideation or homicidal ideation. As per poison control, she was on acetylcysteine protocol. Acetaminophen level is less than 10 today. She has no complaints except headache. 2. Dental caries and abscess- she has been treated with clindamycin. Oral oxycodone. We'll continue clindamycin for 7 days and She'll need follow up at the dental clinic at [facility]. 3. Hypokalemia- replaced. 4. Microcytic anemia- likely iron deficiency.  DISCHARGE CONDITIONS:   Stable, discharged to home today.  CONSULTS OBTAINED:     DRUG ALLERGIES:   Allergies  Allergen Reactions  . Valium [Diazepam] Itching and Rash    DISCHARGE MEDICATIONS:   Discharge Medication List as of 06/29/2015 11:47 AM    START taking these medications   Details  clindamycin (CLEOCIN) 300 MG capsule Take 1 capsule (300 mg total) by mouth 3 (three) times daily., Starting 06/29/2015, Until Discontinued, Print    oxyCODONE (ROXICODONE) 5 MG immediate release tablet Take 1 tablet (5 mg total) by mouth every 6 (six) hours as needed for severe pain., Starting 06/29/2015, Until Discontinued, Print      CONTINUE these medications which have NOT CHANGED   Details  ibuprofen (ADVIL,MOTRIN) 800 MG tablet Take 1 tablet (800 mg total) by mouth every 8 (eight) hours as needed., Starting 05/19/2015,  Until Discontinued, Print      STOP taking these medications     amoxicillin (AMOXIL) 500 MG tablet      HYDROcodone-acetaminophen (NORCO/VICODIN) 5-325 MG tablet      naproxen (NAPROSYN) 500 MG tablet      sulfamethoxazole-trimethoprim (BACTRIM DS,SEPTRA DS) 800-160 MG tablet      traMADol (ULTRAM) 50 MG tablet          DISCHARGE INSTRUCTIONS:    If you experience worsening of your admission symptoms, develop shortness of breath, life threatening emergency, suicidal or homicidal thoughts you must seek medical attention immediately by calling 911 or calling your MD immediately  if symptoms less severe.  You Must read complete instructions/literature along with all the possible adverse reactions/side effects for all the Medicines you take and that have been prescribed to you. Take any new Medicines after you have completely understood and accept all the possible adverse reactions/side effects.   Please note  You were cared for by a hospitalist during your hospital stay. If you have any questions about your discharge medications or the care you received while you were in the hospital after you are discharged, you can call the unit and asked to speak with the hospitalist on call if the hospitalist that took care of you is not available. Once you are discharged, your primary care physician will handle any further medical issues. Please note that NO REFILLS for any discharge medications will be authorized once you are discharged, as it is imperative that you return to your primary care physician (or establish a relationship with a primary care physician if you do  not have one) for your aftercare needs so that they can reassess your need for medications and monitor your lab values.    Today   SUBJECTIVE   Headache.   VITAL SIGNS:  Blood pressure 124/72, pulse 90, temperature 99.6 F (37.6 C), temperature source Oral, resp. rate 18, height  (1.778 m), weight 140.615 kg (310  lb), last menstrual period 06/24/2015, SpO2 100 %.  I/O:   Intake/Output Summary (Last 24 hours) at 06/29/15 1811 Last data filed at 06/29/15 0900  Gross per 24 hour  Intake 885.83 ml  Output      0 ml  Net 885.83 ml    PHYSICAL EXAMINATION:  GENERAL:  25 y.o.-year-old patient lying in the bed with no acute distress. Morbidly obese. EYES: Pupils equal, round, reactive to light and accommodation. No scleral icterus. Extraocular muscles intact.  HEENT: Head atraumatic, normocephalic. Oropharynx and nasopharynx clear.  NECK:  Supple, no jugular venous distention. No thyroid enlargement, no tenderness.  LUNGS: Normal breath sounds bilaterally, no wheezing, rales,rhonchi or crepitation. No use of accessory muscles of respiration.  CARDIOVASCULAR: S1, S2 normal. No murmurs, rubs, or gallops.  ABDOMEN: Soft, non-tender, non-distended. Bowel sounds present. No organomegaly or mass.  EXTREMITIES: No pedal edema, cyanosis, or clubbing.  NEUROLOGIC: Cranial nerves II through XII are intact. Muscle strength 5/5 in all extremities. Sensation intact. Gait not checked.  PSYCHIATRIC: The patient is alert and oriented x 3.  SKIN: No obvious rash, lesion, or ulcer.   DATA REVIEW:   CBC  Recent Labs Lab 06/28/15 0506  WBC 8.7  HGB 10.0*  HCT 31.8*  PLT 300    Chemistries   Recent Labs Lab 06/28/15 0506 06/28/15 1552  NA 136  --   K 3.3*  --   CL 107  --   CO2 22  --   GLUCOSE 100*  --   BUN <5*  --   CREATININE 0.65  --   CALCIUM 8.5*  --   AST 16 14*  ALT 14 13*  ALKPHOS 51  --   BILITOT 0.2*  --     Cardiac Enzymes No results for input(s): TROPONINI in the last 168 hours.  Microbiology Results  Results for orders placed or performed during the hospital encounter of 06/27/15  MRSA PCR Screening     Status: None   Collection Time: 06/28/15 12:45 AM  Result Value Ref Range Status   MRSA by PCR NEGATIVE NEGATIVE Final    Comment:        The GeneXpert MRSA Assay  (FDA approved for NASAL specimens only), is one component of a comprehensive MRSA colonization surveillance program. It is not intended to diagnose MRSA infection nor to guide or monitor treatment for MRSA infections.     RADIOLOGY:  No results found.      Management plans discussed with the patient, family and they are in agreement.  CODE STATUS:  Code Status History    Date Active Date Inactive Code Status Order ID Comments User Context   06/28/2015 12:43 AM 06/29/2015  4:37 PM Full Code [id number]  Altamese Dilling, MD Inpatient      TOTAL TIME TAKING CARE OF THIS PATIENT: 32 minutes.    Shaune Pollack M.D on 06/29/2015 at 6:11 PM  Between 7am to 6pm - Pager - [phone]  After 6pm go to [email or web address] - password [email or web address] [facility]  [facility]  Office  [phone]  CC: Primary care physician; No PCP Per Patient

## 2015-06-29 NOTE — Progress Notes (Signed)
IV removed. Discussed DC instructions with pt who verbalized understanding. Prescriptions given as well as note for work/school release. Pt ambulated to lobby with nurse and sister picked her up. All belongings with patient. Pt left at 12:30

## 2015-06-29 NOTE — Progress Notes (Signed)
[facility] - [place] at [facility] Linda Sawyer was admitted to the Hospital on 06/27/2015 and Discharged  06/29/2015 and should be excused from work/school   for 5  days starting 06/27/2015 , may return to work/school without any restrictions.  Shaune Pollack MD.  Shaune Pollack M.D on 06/29/2015,at 11:39 AM  [facility] -  at [facility] [place]  [phone]

## 2015-12-16 ENCOUNTER — Encounter: Payer: Self-pay | Admitting: Emergency Medicine

## 2015-12-16 DIAGNOSIS — Z87891 Personal history of nicotine dependence: Secondary | ICD-10-CM | POA: Insufficient documentation

## 2015-12-16 DIAGNOSIS — Z791 Long term (current) use of non-steroidal anti-inflammatories (NSAID): Secondary | ICD-10-CM | POA: Insufficient documentation

## 2015-12-16 DIAGNOSIS — W228XXA Striking against or struck by other objects, initial encounter: Secondary | ICD-10-CM | POA: Insufficient documentation

## 2015-12-16 DIAGNOSIS — Y9389 Activity, other specified: Secondary | ICD-10-CM | POA: Insufficient documentation

## 2015-12-16 DIAGNOSIS — S60211A Contusion of right wrist, initial encounter: Secondary | ICD-10-CM | POA: Insufficient documentation

## 2015-12-16 DIAGNOSIS — Z792 Long term (current) use of antibiotics: Secondary | ICD-10-CM | POA: Insufficient documentation

## 2015-12-16 DIAGNOSIS — S9031XA Contusion of right foot, initial encounter: Secondary | ICD-10-CM | POA: Insufficient documentation

## 2015-12-16 DIAGNOSIS — Z79899 Other long term (current) drug therapy: Secondary | ICD-10-CM | POA: Insufficient documentation

## 2015-12-16 DIAGNOSIS — Y999 Unspecified external cause status: Secondary | ICD-10-CM | POA: Insufficient documentation

## 2015-12-16 DIAGNOSIS — Y929 Unspecified place or not applicable: Secondary | ICD-10-CM | POA: Insufficient documentation
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
## 2015-12-17 ENCOUNTER — Emergency Department: Payer: Self-pay

## 2015-12-17 ENCOUNTER — Emergency Department
Admission: EM | Admit: 2015-12-17 | Discharge: 2015-12-17 | Disposition: A | Discharge status: Home / Self Care | Payer: Self-pay | Attending: Emergency Medicine | Admitting: Emergency Medicine

## 2015-12-17 DIAGNOSIS — S9031XA Contusion of right foot, initial encounter: Secondary | ICD-10-CM

## 2015-12-17 DIAGNOSIS — S60211A Contusion of right wrist, initial encounter: Secondary | ICD-10-CM

## 2015-12-17 MED ORDER — OXYCODONE-ACETAMINOPHEN 5-325 MG PO TABS
1.0000 | ORAL_TABLET | Freq: Once | ORAL | Status: AC
  Administered 2015-12-17: 1 via ORAL
  Filled 2015-12-17: qty 1
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
## 2015-12-17 NOTE — ED Notes (Signed)
Reviewed d/c instructions, follow-up care, OTC pain medications, use of ice/elevation, use of crutches with pt. Pt verbalized understanding

## 2015-12-17 NOTE — Discharge Instructions (Signed)
Foot Contusion A foot contusion is a deep bruise to the foot. Contusions are the result of an injury that caused bleeding under the skin. The contusion may turn blue, purple, or yellow. Minor injuries will give you a painless contusion, but more severe contusions may stay painful and swollen for a few weeks. CAUSES  A foot contusion comes from a direct blow to that area, such as a heavy object falling on the foot. SYMPTOMS   Swelling of the foot.  Discoloration of the foot.  Tenderness or soreness of the foot. DIAGNOSIS  You will have a physical exam and will be asked about your history. You may need an X-ray of your foot to look for a broken bone (fracture).  TREATMENT  An elastic wrap may be recommended to support your foot. Resting, elevating, and applying cold compresses to your foot are often the best treatments for a foot contusion. Over-the-counter medicines may also be recommended for pain control. HOME CARE INSTRUCTIONS   Put ice on the injured area.  Put ice in a plastic bag.  Place a towel between your skin and the bag.  Leave the ice on for 15-20 minutes, 03-04 times a day.  Only take over-the-counter or prescription medicines for pain, discomfort, or fever as directed by your caregiver.  If told, use an elastic wrap as directed. This can help reduce swelling. You may remove the wrap for sleeping, showering, and bathing. If your toes become numb, cold, or blue, take the wrap off and reapply it more loosely.  Elevate your foot with pillows to reduce swelling.  Try to avoid standing or walking while the foot is painful. Do not resume use until instructed by your caregiver. Then, begin use gradually. If pain develops, decrease use. Gradually increase activities that do not cause discomfort until you have normal use of your foot.  See your caregiver as directed. It is very important to keep all follow-up appointments in order to avoid any lasting problems with your foot,  including long-term (chronic) pain. SEEK IMMEDIATE MEDICAL CARE IF:   You have increased redness, swelling, or pain in your foot.  Your swelling or pain is not relieved with medicines.  You have loss of feeling in your foot or are unable to move your toes.  Your foot turns cold or blue.  You have pain when you move your toes.  Your foot becomes warm to the touch.  Your contusion does not improve in 2 days. MAKE SURE YOU:   Understand these instructions.  Will watch your condition.  Will get help right away if you are not doing well or get worse.   This information is not intended to replace advice given to you by your health care provider. Make sure you discuss any questions you have with your health care provider.   Document Released: 03/20/2006 Document Revised: 11/28/2011 Document Reviewed: 02/02/2015 Elsevier Interactive Patient Education 2016 [employer]  Contusion A contusion is a deep bruise. Contusions are the result of a blunt injury to tissues and muscle fibers under the skin. The injury causes bleeding under the skin. The skin overlying the contusion may turn blue, purple, or yellow. Minor injuries will give you a painless contusion, but more severe contusions may stay painful and swollen for a few weeks.  CAUSES  This condition is usually caused by a blow, trauma, or direct force to an area of the body. SYMPTOMS  Symptoms of this condition include:  Swelling of the injured area.  Pain  and tenderness in the injured area. °· Discoloration. The area may have redness and then turn blue, purple, or yellow. °DIAGNOSIS  °This condition is diagnosed based on a physical exam and medical history. An X-ray, CT scan, or MRI may be needed to determine if there are any associated injuries, such as broken bones (fractures). °TREATMENT  °Specific treatment for this condition depends on what area of the body was injured. In general, the best treatment for a contusion is resting,  icing, applying pressure to (compression), and elevating the injured area. This is often called the RICE strategy. Over-the-counter anti-inflammatory medicines may also be recommended for pain control.  °HOME CARE INSTRUCTIONS  °· Rest the injured area. °· If directed, apply ice to the injured area: °¨ Put ice in a plastic bag. °¨ Place a towel between your skin and the bag. °¨ Leave the ice on for 20 minutes, 2-3 times per day. °· If directed, apply light compression to the injured area using an elastic bandage. Make sure the bandage is not wrapped too tightly. Remove and reapply the bandage as directed by your health care provider. °· If possible, raise (elevate) the injured area above the level of your heart while you are sitting or lying down. °· Take over-the-counter and prescription medicines only as told by your health care provider. °SEEK MEDICAL CARE IF: °· Your symptoms do not improve after several days of treatment. °· Your symptoms get worse. °· You have difficulty moving the injured area. °SEEK IMMEDIATE MEDICAL CARE IF:  °· You have severe pain. °· You have numbness in a hand or foot. °· Your hand or foot turns pale or cold. °  °This information is not intended to replace advice given to you by your health care provider. Make sure you discuss any questions you have with your health care provider. °  °Document Released: 03/08/2005 Document Revised: 02/17/2015 Document Reviewed: 10/14/2014 °Elsevier Interactive Patient Education ©2016 Elsevier Inc. ° °

## 2015-12-17 NOTE — ED Notes (Signed)
Pt has abrasion to right ventral forearm

## 2015-12-17 NOTE — ED Notes (Signed)
MD Brown at bedside.

## 2016-02-07 ENCOUNTER — Encounter: Payer: Self-pay | Admitting: *Deleted

## 2016-02-07 ENCOUNTER — Emergency Department
Admission: EM | Admit: 2016-02-07 | Discharge: 2016-02-08 | Disposition: A | Discharge status: Home / Self Care | Payer: Self-pay | Attending: Emergency Medicine | Admitting: Emergency Medicine

## 2016-02-07 DIAGNOSIS — B9689 Other specified bacterial agents as the cause of diseases classified elsewhere: Secondary | ICD-10-CM

## 2016-02-07 DIAGNOSIS — N760 Acute vaginitis: Secondary | ICD-10-CM | POA: Insufficient documentation

## 2016-02-07 DIAGNOSIS — R52 Pain, unspecified: Secondary | ICD-10-CM

## 2016-02-07 DIAGNOSIS — R102 Pelvic and perineal pain: Secondary | ICD-10-CM

## 2016-02-07 DIAGNOSIS — Z87891 Personal history of nicotine dependence: Secondary | ICD-10-CM | POA: Insufficient documentation

## 2016-02-07 DIAGNOSIS — Z791 Long term (current) use of non-steroidal anti-inflammatories (NSAID): Secondary | ICD-10-CM | POA: Insufficient documentation

## 2016-02-07 LAB — COMPREHENSIVE METABOLIC PANEL
ALK PHOS: 63 U/L (ref 38–126)
ALT: 14 U/L (ref 14–54)
AST: 17 U/L (ref 15–41)
Albumin: 3.6 g/dL (ref 3.5–5.0)
Anion gap: 0 — ABNORMAL LOW (ref 5–15)
BILIRUBIN TOTAL: 0.4 mg/dL (ref 0.3–1.2)
BUN: 8 mg/dL (ref 6–20)
CALCIUM: 9.1 mg/dL (ref 8.9–10.3)
CO2: 32 mmol/L (ref 22–32)
CREATININE: 0.85 mg/dL (ref 0.44–1.00)
Chloride: 104 mmol/L (ref 101–111)
Glucose, Bld: 92 mg/dL (ref 65–99)
Potassium: 3.9 mmol/L (ref 3.5–5.1)
Sodium: 136 mmol/L (ref 135–145)
TOTAL PROTEIN: 8.1 g/dL (ref 6.5–8.1)

## 2016-02-07 LAB — URINALYSIS COMPLETE WITH MICROSCOPIC (ARMC ONLY)
BACTERIA UA: NONE SEEN
Bilirubin Urine: NEGATIVE
Glucose, UA: NEGATIVE mg/dL
HGB URINE DIPSTICK: NEGATIVE
Ketones, ur: NEGATIVE mg/dL
LEUKOCYTES UA: NEGATIVE
Nitrite: NEGATIVE
PH: 7 (ref 5.0–8.0)
PROTEIN: NEGATIVE mg/dL
Specific Gravity, Urine: 1.019 (ref 1.005–1.030)

## 2016-02-07 LAB — CBC
HCT: 31.9 % — ABNORMAL LOW (ref 35.0–47.0)
Hemoglobin: 10 g/dL — ABNORMAL LOW (ref 12.0–16.0)
MCH: 20.1 pg — ABNORMAL LOW (ref 26.0–34.0)
MCHC: 31.3 g/dL — ABNORMAL LOW (ref 32.0–36.0)
MCV: 64.2 fL — AB (ref 80.0–100.0)
PLATELETS: 304 10*3/uL (ref 150–440)
RBC: 4.97 MIL/uL (ref 3.80–5.20)
RDW: 19.6 % — AB (ref 11.5–14.5)
WBC: 10 10*3/uL (ref 3.6–11.0)

## 2016-02-07 LAB — POCT PREGNANCY, URINE: PREG TEST UR: NEGATIVE

## 2016-02-07 NOTE — ED Provider Notes (Signed)
[facility] Emergency Department Provider Note   ____________________________________________   First MD Initiated Contact with Patient 02/07/16 2321     (approximate)  I have reviewed the triage vital signs and the nursing notes.   HISTORY  Chief Complaint Abdominal Pain    HPI Linda Sawyer is a 25 y.o. female who presents to the ED from home with a chief complaint of pelvic pain. Reports positive home pregnancy test 2 days ago. States her periods are irregular; LMP in June. Complains of bilateral pelvic pain x 1 day. Symptoms are not associated with fever, chills, chest pain, shortness of breath, nausea, vomiting, dysuria, vaginal discharge or vaginal bleeding. Last sexual intercourse one and a half weeks ago. No concerns for STDs. Denies recent travel or trauma. Nothing makes her symptoms better or worse.   Past Medical History None  Patient Active Problem List   Diagnosis Date Noted  . Acetaminophen overdose 06/27/2015    Past Surgical History:  Procedure Laterality Date  . HAND SURGERY      Prior to Admission medications   Medication Sig Start Date End Date Taking? Authorizing Provider  clindamycin (CLEOCIN) 300 MG capsule Take 1 capsule (300 mg total) by mouth 3 (three) times daily. 06/29/15   Shaune PollackQing Chen, MD  ibuprofen (ADVIL,MOTRIN) 800 MG tablet Take 1 tablet (800 mg total) by mouth every 8 (eight) hours as needed. 05/19/15   Chinita Pesterari B Triplett, FNP  metroNIDAZOLE (FLAGYL) 500 MG tablet Take 1 tablet (500 mg total) by mouth 2 (two) times daily. 02/08/16   Irean HongJade J Sung, MD  oxyCODONE (ROXICODONE) 5 MG immediate release tablet Take 1 tablet (5 mg total) by mouth every 6 (six) hours as needed for severe pain. 06/29/15   Shaune PollackQing Chen, MD    Allergies Valium [diazepam]  Family History  Problem Relation Age of Onset  . Diabetes Mother   . Asthma Brother   . Hodgkin's lymphoma Maternal Aunt     Social History Social History  Substance Use Topics    . Smoking status: Former Smoker    Quit date: 03/01/2015  . Smokeless tobacco: Never Used  . Alcohol use No    Review of Systems  Constitutional: No fever/chills. Eyes: No visual changes. ENT: No sore throat. Cardiovascular: Denies chest pain. Respiratory: Denies shortness of breath. Gastrointestinal: Positive for pelvic pain.  No nausea, no vomiting.  No diarrhea.  No constipation. Genitourinary: Negative for dysuria. Musculoskeletal: Negative for back pain. Skin: Negative for rash. Neurological: Negative for headaches, focal weakness or numbness.  10-point ROS otherwise negative.  ____________________________________________   PHYSICAL EXAM:  VITAL SIGNS: ED Triage Vitals  Enc Vitals Group     BP 02/07/16 2137 133/77     Pulse Rate 02/07/16 2137 69     Resp 02/07/16 2137 18     Temp 02/07/16 2137 98.4 F (36.9 C)     Temp Source 02/07/16 2137 Oral     SpO2 02/07/16 2137 100 %     Weight 02/07/16 2138 (!) 315 lb (142.9 kg)     Height 02/07/16 2138 5\' 10"  (1.778 m)     Head Circumference --      Peak Flow --      Pain Score 02/07/16 2139 9     Pain Loc --      Pain Edu? --      Excl. in GC? --     Constitutional: Alert and oriented. Well appearing and in no acute distress. Eyes: Conjunctivae  are normal. PERRL. EOMI. Head: Atraumatic. Nose: No congestion/rhinnorhea. Mouth/Throat: Mucous membranes are moist.  Oropharynx non-erythematous. Neck: No stridor.   Cardiovascular: Normal rate, regular rhythm. Grossly normal heart sounds.  Good peripheral circulation. Respiratory: Normal respiratory effort.  No retractions. Lungs CTAB. Gastrointestinal: Soft and nontender to light or deep palpation. No distention. No abdominal bruits. No CVA tenderness. Musculoskeletal: No lower extremity tenderness nor edema.  No joint effusions. Neurologic:  Normal speech and language. No gross focal neurologic deficits are appreciated. No gait instability. Skin:  Skin is warm, dry  and intact. No rash noted. Psychiatric: Mood and affect are normal. Speech and behavior are normal.  ____________________________________________   LABS (all labs ordered are listed, but only abnormal results are displayed)  Labs Reviewed  WET PREP, GENITAL - Abnormal; Notable for the following:       Result Value   Clue Cells Wet Prep HPF POC PRESENT (*)    WBC, Wet Prep HPF POC FEW (*)    All other components within normal limits  COMPREHENSIVE METABOLIC PANEL - Abnormal; Notable for the following:    Anion gap 0 (*)    All other components within normal limits  CBC - Abnormal; Notable for the following:    Hemoglobin 10.0 (*)    HCT 31.9 (*)    MCV 64.2 (*)    MCH 20.1 (*)    MCHC 31.3 (*)    RDW 19.6 (*)    All other components within normal limits  URINALYSIS COMPLETEWITH MICROSCOPIC ([facility] ONLY) - Abnormal; Notable for the following:    Color, Urine YELLOW (*)    APPearance CLEAR (*)    Squamous Epithelial / LPF 6-30 (*)    All other components within normal limits  CHLAMYDIA/NGC RT PCR ([facility] ONLY)  HCG, QUANTITATIVE, PREGNANCY  POC URINE PREG, ED  POCT PREGNANCY, URINE   ____________________________________________  EKG  None ____________________________________________  RADIOLOGY  Pelvic ultrasound interpreted per Dr. Andria Meuse: Normal ultrasound appearance of the uterus and ovaries. No evidence  of ovarian mass or torsion.    ____________________________________________   PROCEDURES  Procedure(s) performed:   Pelvic exam: External exam WNL without rashes, lesions or vesicles. Speculum exam reveals mild white discharge. No vaginal bleeding. Cervical os closed. Bimanual exam WNL.  Procedures  Critical Care performed: No  ____________________________________________   INITIAL IMPRESSION / ASSESSMENT AND PLAN / ED COURSE  Pertinent labs & imaging results that were available during my care of the patient were reviewed by me and considered  in my medical decision making (see chart for details).  25 year old female who presents with pelvic pain and positive home pregnancy test. Urine pregnancy test here is negative. Awaiting beta hCG. Laboratory and urinalysis remarkable for mild anemia. Will proceed with pelvic ultrasound.  Clinical Course  Comment By Time  Updated patient on imaging results. Prescription for Flagyl provided for bacterial vaginosis. Patient will follow-up with GYN as needed. Strict return precautions given. Patient verbalizes understanding and agrees with plan of care. Irean Hong, MD 08/29 0121     ____________________________________________   FINAL CLINICAL IMPRESSION(S) / ED DIAGNOSES  Final diagnoses:  Pain  Pelvic pain in female  Bacterial vaginosis      NEW MEDICATIONS STARTED DURING THIS VISIT:  New Prescriptions   METRONIDAZOLE (FLAGYL) 500 MG TABLET    Take 1 tablet (500 mg total) by mouth 2 (two) times daily.     Note:  This document was prepared using Dragon voice recognition software and may include unintentional  dictation errors.    Irean Hong, MD 02/08/16 [phone]

## 2016-02-07 NOTE — ED Notes (Signed)
MD at bedside. 

## 2016-02-07 NOTE — ED Triage Notes (Signed)
Pt has lower abd pain.  positive home preg test 2 days ago.  No vag bleeding or discharge.  No dysuria. Pt reports lower abd pressure.

## 2016-02-08 ENCOUNTER — Telehealth: Payer: Self-pay | Admitting: Emergency Medicine

## 2016-02-08 ENCOUNTER — Emergency Department: Payer: Self-pay

## 2016-02-08 LAB — CHLAMYDIA/NGC RT PCR (ARMC ONLY)
CHLAMYDIA TR: DETECTED — AB
N GONORRHOEAE: NOT DETECTED

## 2016-02-08 LAB — HCG, QUANTITATIVE, PREGNANCY: HCG, BETA CHAIN, QUANT, S: 1 m[IU]/mL (ref <5)

## 2016-02-08 LAB — WET PREP, GENITAL
Sperm: NONE SEEN
TRICH WET PREP: NONE SEEN
YEAST WET PREP: NONE SEEN

## 2016-02-08 MED ORDER — METRONIDAZOLE 500 MG PO TABS: 500.0000 mg | ORAL_TABLET | Freq: Two times a day (BID) | ORAL | 0 refills | Status: DC

## 2016-02-08 NOTE — Telephone Encounter (Signed)
Called to inform of positive chlamydia test and need for treatment.  Per Dr. Inocencio HomesGayle may call in azithromycin 1 gram po to pt preferred pharmacy.  Phone said unavailable--will try this afternoon. If no answer will send letter.

## 2016-02-08 NOTE — ED Notes (Signed)

## 2016-02-08 NOTE — Discharge Instructions (Signed)
1. Take antibiotic as prescribed (Flagyl 500 mg twice daily ×7 days). °2. Return to the ER for worsening symptoms, persistent vomiting, difficulty breathing or other concerns. °

## 2016-04-16 ENCOUNTER — Emergency Department
Admission: EM | Admit: 2016-04-16 | Discharge: 2016-04-16 | Disposition: A | Discharge status: Home / Self Care | Payer: Self-pay | Attending: Emergency Medicine | Admitting: Emergency Medicine

## 2016-04-16 ENCOUNTER — Encounter: Payer: Self-pay | Admitting: Emergency Medicine

## 2016-04-16 DIAGNOSIS — J069 Acute upper respiratory infection, unspecified: Secondary | ICD-10-CM | POA: Insufficient documentation

## 2016-04-16 DIAGNOSIS — Z79899 Other long term (current) drug therapy: Secondary | ICD-10-CM | POA: Insufficient documentation

## 2016-04-16 DIAGNOSIS — Z202 Contact with and (suspected) exposure to infections with a predominantly sexual mode of transmission: Secondary | ICD-10-CM | POA: Insufficient documentation

## 2016-04-16 DIAGNOSIS — Z87891 Personal history of nicotine dependence: Secondary | ICD-10-CM | POA: Insufficient documentation

## 2016-04-16 LAB — URINALYSIS COMPLETE WITH MICROSCOPIC (ARMC ONLY)
BACTERIA UA: NONE SEEN
BILIRUBIN URINE: NEGATIVE
GLUCOSE, UA: NEGATIVE mg/dL
LEUKOCYTES UA: NEGATIVE
NITRITE: NEGATIVE
Protein, ur: NEGATIVE mg/dL
SPECIFIC GRAVITY, URINE: 1.023 (ref 1.005–1.030)
pH: 5 (ref 5.0–8.0)

## 2016-04-16 LAB — POCT PREGNANCY, URINE: PREG TEST UR: NEGATIVE

## 2016-04-16 MED ORDER — CEFTRIAXONE SODIUM 1 G IJ SOLR
500.0000 mg | Freq: Once | INTRAMUSCULAR | Status: AC
  Administered 2016-04-16: 500 mg via INTRAMUSCULAR
  Filled 2016-04-16: qty 10

## 2016-04-16 MED ORDER — ONDANSETRON 4 MG PO TBDP
ORAL_TABLET | ORAL | Status: AC
  Filled 2016-04-16: qty 1

## 2016-04-16 MED ORDER — ONDANSETRON 4 MG PO TBDP
4.0000 mg | ORAL_TABLET | Freq: Once | ORAL | Status: AC
  Administered 2016-04-16: 4 mg via ORAL

## 2016-04-16 MED ORDER — ACETAMINOPHEN-CODEINE 120-12 MG/5ML PO SUSP: 10.0000 mL | Freq: Four times a day (QID) | ORAL | 0 refills | Status: AC | PRN

## 2016-04-16 MED ORDER — AZITHROMYCIN 500 MG PO TABS
1000.0000 mg | ORAL_TABLET | Freq: Every day | ORAL | Status: DC
  Administered 2016-04-16: 1000 mg via ORAL
  Filled 2016-04-16: qty 2

## 2016-04-16 NOTE — ED Triage Notes (Signed)
Pt reports recent cold symptoms and fever 100. For past 2 days.   Pt also states her partner tested positive for gonorrhea and would like testing. Reports urinary frequency denies burning or pain.

## 2016-04-16 NOTE — ED Provider Notes (Signed)
[facility] Emergency Department Provider Note  ____________________________________________   First MD Initiated Contact with Patient 04/16/16 1815     (approximate)  I have reviewed the triage vital signs and the nursing notes.   HISTORY  Chief Complaint URI and SEXUALLY TRANSMITTED DISEASE   HPI Linda Sawyer is a 25 y.o. female who presents to the emergency department for evaluation of cold symptoms that started 2 days ago. She states that she's had a cough, sore throat, fever, runny nose, generalized body aches and chills with some nausea. She has not taken any medications for her symptoms. She also reports exposure to gonorrhea. She complains of urinary frequency but denies dysuria or abdominal pain.   History reviewed. No pertinent past medical history.  Patient Active Problem List   Diagnosis Date Noted  . Acetaminophen overdose 06/27/2015    Past Surgical History:  Procedure Laterality Date  . HAND SURGERY      Prior to Admission medications   Medication Sig Start Date End Date Taking? Authorizing Provider  acetaminophen-codeine 120-12 MG/5ML suspension Take 10 mLs by mouth every 6 (six) hours as needed for pain. 04/16/16 04/16/17  Chinita Pesterari B Railee Bonillas, FNP  clindamycin (CLEOCIN) 300 MG capsule Take 1 capsule (300 mg total) by mouth 3 (three) times daily. 06/29/15   Shaune PollackQing Chen, MD  ibuprofen (ADVIL,MOTRIN) 800 MG tablet Take 1 tablet (800 mg total) by mouth every 8 (eight) hours as needed. 05/19/15   Chinita Pesterari B Locklyn Henriquez, FNP  metroNIDAZOLE (FLAGYL) 500 MG tablet Take 1 tablet (500 mg total) by mouth 2 (two) times daily. 02/08/16   Irean HongJade J Sung, MD  oxyCODONE (ROXICODONE) 5 MG immediate release tablet Take 1 tablet (5 mg total) by mouth every 6 (six) hours as needed for severe pain. 06/29/15   Shaune PollackQing Chen, MD    Allergies Valium [diazepam]  Family History  Problem Relation Age of Onset  . Diabetes Mother   . Asthma Brother   . Hodgkin's lymphoma Maternal  Aunt     Social History Social History  Substance Use Topics  . Smoking status: Former Smoker    Quit date: 03/01/2015  . Smokeless tobacco: Never Used  . Alcohol use No    Review of Systems Constitutional: Positive for fever and chills Eyes: No visual changes. ENT: No sore throat. Cardiovascular: Denies chest pain. Positive for cough Respiratory: Denies shortness of breath. Gastrointestinal: No abdominal pain.  Positive for nausea without vomiting.  No diarrhea.  No constipation. Genitourinary: Negative for dysuria. Positive for urinary frequency Musculoskeletal: Negative for back pain. Skin: Negative for rash. Neurological: Negative for headaches, focal weakness or numbness.  ____________________________________________   PHYSICAL EXAM:  VITAL SIGNS: ED Triage Vitals  Enc Vitals Group     BP 04/16/16 1750 130/76     Pulse Rate 04/16/16 1750 98     Resp 04/16/16 1750 20     Temp 04/16/16 1750 98.8 F (37.1 C)     Temp Source 04/16/16 1750 Oral     SpO2 04/16/16 1750 100 %     Weight 04/16/16 1754 (!) 333 lb (151 kg)     Height 04/16/16 1751 5\' 10"  (1.778 m)     Head Circumference --      Peak Flow --      Pain Score 04/16/16 1751 7     Pain Loc --      Pain Edu? --      Excl. in GC? --     Constitutional: Alert  and oriented. Well appearing and in no acute distress. Eyes: Conjunctivae are normal. PERRL. EOMI. Head: Atraumatic. Nose: Clear rhinorrhea is present with boggy mucous membranes. Mouth/Throat: Mucous membranes are moist.  Oropharynx Mildly erythematous. Tonsils 1+ without exudate Neck: No stridor.   Hematological/Lymphatic/Immunilogical: No cervical lymphadenopathy. Cardiovascular: Normal rate, regular rhythm. Grossly normal heart sounds.  Good peripheral circulation. Respiratory: Normal respiratory effort.  No retractions. Lungs CTAB. Gastrointestinal: Soft and nontender. No distention. No abdominal bruits. No CVA tenderness. Genitourinary: Exam  deferred Musculoskeletal: No lower extremity tenderness nor edema.  No joint effusions. Neurologic:  Normal speech and language. No gross focal neurologic deficits are appreciated. No gait instability. Skin:  Skin is warm, dry and intact. No rash noted. Psychiatric: Mood and affect are normal. Speech and behavior are normal.  ____________________________________________   LABS (all labs ordered are listed, but only abnormal results are displayed)  Labs Reviewed  URINALYSIS COMPLETEWITH MICROSCOPIC ([facility] ONLY) - Abnormal; Notable for the following:       Result Value   Color, Urine YELLOW (*)    APPearance CLEAR (*)    Ketones, ur TRACE (*)    Hgb urine dipstick 1+ (*)    Squamous Epithelial / LPF 6-30 (*)    All other components within normal limits  POC URINE PREG, ED  POCT PREGNANCY, URINE   ____________________________________________  EKG   ____________________________________________  RADIOLOGY  Not indicated ____________________________________________   PROCEDURES  Procedure(s) performed: None  Procedures  Critical Care performed: No  ____________________________________________   INITIAL IMPRESSION / ASSESSMENT AND PLAN / ED COURSE  Pertinent labs & imaging results that were available during my care of the patient were reviewed by me and considered in my medical decision making (see chart for details).  Patient will be treated with IM Rocephin and azithromycin by mouth prior to discharge from the emergency department. Pelvic exam not done tonight as it would not change the treatment plan. She denies abdominal pain or painful intercourse. She is to follow-up with the health department in 2 weeks to have a full screening for STDs. Patient agrees to this plan. She was advised to avoid intercourse for a full week or at any time if she has any vaginal discharge. She reports that she is no longer with the person from which she was exposed to gonorrhea.  She'll  be prescribed Tylenol with Codeine elixir to be taken for URI symptoms. She was advised follow-up with primary care provider of her choice for symptoms that are not improving over the next few days. She was advised to return to the emergency department for symptoms that change or worsen if she is unable to schedule an appointment.  Clinical Course      ____________________________________________   FINAL CLINICAL IMPRESSION(S) / ED DIAGNOSES  Final diagnoses:  Viral upper respiratory tract infection  Exposure to gonorrhea      NEW MEDICATIONS STARTED DURING THIS VISIT:  Discharge Medication List as of 04/16/2016  6:43 PM    START taking these medications   Details  acetaminophen-codeine 120-12 MG/5ML suspension Take 10 mLs by mouth every 6 (six) hours as needed for pain., Starting Sun 04/16/2016, Until Mon 04/16/2017, Print         Note:  This document was prepared using Dragon voice recognition software and may include unintentional dictation errors.    Chinita PesterCari B Brinkley Peet, FNP 04/16/16 1937    Sharman CheekPhillip Stafford, MD 04/16/16 [phone]

## 2016-04-16 NOTE — ED Notes (Addendum)
Pt c/o frequent urination with flu like symptoms, fever and body aches. States her partner tested positive for gonorrhea xfew days ago. Pt denies any dysuria.

## 2016-08-31 ENCOUNTER — Emergency Department
Admission: EM | Admit: 2016-08-31 | Discharge: 2016-08-31 | Disposition: A | Discharge status: Home / Self Care | Payer: Self-pay | Attending: Emergency Medicine | Admitting: Emergency Medicine

## 2016-08-31 ENCOUNTER — Encounter: Payer: Self-pay | Admitting: Emergency Medicine

## 2016-08-31 ENCOUNTER — Emergency Department: Payer: Self-pay

## 2016-08-31 DIAGNOSIS — Z79899 Other long term (current) drug therapy: Secondary | ICD-10-CM | POA: Insufficient documentation

## 2016-08-31 DIAGNOSIS — Z87891 Personal history of nicotine dependence: Secondary | ICD-10-CM | POA: Insufficient documentation

## 2016-08-31 DIAGNOSIS — N739 Female pelvic inflammatory disease, unspecified: Secondary | ICD-10-CM | POA: Insufficient documentation

## 2016-08-31 DIAGNOSIS — R109 Unspecified abdominal pain: Secondary | ICD-10-CM

## 2016-08-31 DIAGNOSIS — N760 Acute vaginitis: Secondary | ICD-10-CM | POA: Insufficient documentation

## 2016-08-31 DIAGNOSIS — R102 Pelvic and perineal pain: Secondary | ICD-10-CM

## 2016-08-31 DIAGNOSIS — B9689 Other specified bacterial agents as the cause of diseases classified elsewhere: Secondary | ICD-10-CM

## 2016-08-31 DIAGNOSIS — N730 Acute parametritis and pelvic cellulitis: Secondary | ICD-10-CM

## 2016-08-31 LAB — COMPREHENSIVE METABOLIC PANEL
ALBUMIN: 4 g/dL (ref 3.5–5.0)
ALK PHOS: 70 U/L (ref 38–126)
ALT: 14 U/L (ref 14–54)
ANION GAP: 7 (ref 5–15)
AST: 18 U/L (ref 15–41)
BILIRUBIN TOTAL: 0.7 mg/dL (ref 0.3–1.2)
BUN: 8 mg/dL (ref 6–20)
CALCIUM: 9.1 mg/dL (ref 8.9–10.3)
CO2: 23 mmol/L (ref 22–32)
CREATININE: 0.75 mg/dL (ref 0.44–1.00)
Chloride: 105 mmol/L (ref 101–111)
GFR calc Af Amer: >60 mL/min (ref >60)
GFR calc non Af Amer: >60 mL/min (ref >60)
GLUCOSE: 99 mg/dL (ref 65–99)
Potassium: 3.7 mmol/L (ref 3.5–5.1)
Sodium: 135 mmol/L (ref 135–145)
TOTAL PROTEIN: 9.3 g/dL — AB (ref 6.5–8.1)

## 2016-08-31 LAB — WET PREP, GENITAL
Sperm: NONE SEEN
TRICH WET PREP: NONE SEEN
Yeast Wet Prep HPF POC: NONE SEEN

## 2016-08-31 LAB — CBC
HCT: 36 % (ref 35.0–47.0)
Hemoglobin: 11.4 g/dL — ABNORMAL LOW (ref 12.0–16.0)
MCH: 20.3 pg — ABNORMAL LOW (ref 26.0–34.0)
MCHC: 31.6 g/dL — AB (ref 32.0–36.0)
MCV: 64.1 fL — ABNORMAL LOW (ref 80.0–100.0)
PLATELETS: 342 10*3/uL (ref 150–440)
RBC: 5.61 MIL/uL — ABNORMAL HIGH (ref 3.80–5.20)
RDW: 18.5 % — ABNORMAL HIGH (ref 11.5–14.5)
WBC: 8 10*3/uL (ref 3.6–11.0)

## 2016-08-31 LAB — RAPID HIV SCREEN (HIV 1/2 AB+AG)
HIV 1/2 Antibodies: NONREACTIVE
HIV-1 P24 Antigen - HIV24: NONREACTIVE

## 2016-08-31 LAB — URINALYSIS, COMPLETE (UACMP) WITH MICROSCOPIC
Bacteria, UA: NONE SEEN
Bilirubin Urine: NEGATIVE
GLUCOSE, UA: NEGATIVE mg/dL
Ketones, ur: 5 mg/dL — AB
NITRITE: NEGATIVE
Protein, ur: 100 mg/dL — AB
SPECIFIC GRAVITY, URINE: 1.028 (ref 1.005–1.030)
pH: 6 (ref 5.0–8.0)

## 2016-08-31 LAB — CHLAMYDIA/NGC RT PCR (ARMC ONLY)
CHLAMYDIA TR: NOT DETECTED
N gonorrhoeae: NOT DETECTED

## 2016-08-31 LAB — POCT PREGNANCY, URINE: Preg Test, Ur: NEGATIVE

## 2016-08-31 LAB — LIPASE, BLOOD: Lipase: 27 U/L (ref 11–51)

## 2016-08-31 MED ORDER — CEFTRIAXONE SODIUM 250 MG IJ SOLR
INTRAMUSCULAR | Status: AC
  Administered 2016-08-31: 250 mg via INTRAMUSCULAR
  Filled 2016-08-31: qty 250

## 2016-08-31 MED ORDER — CEFTRIAXONE SODIUM 250 MG IJ SOLR
250.0000 mg | Freq: Once | INTRAMUSCULAR | Status: AC
  Administered 2016-08-31: 250 mg via INTRAMUSCULAR

## 2016-08-31 MED ORDER — METRONIDAZOLE 500 MG PO TABS: 500.0000 mg | ORAL_TABLET | Freq: Two times a day (BID) | ORAL | 0 refills | Status: AC

## 2016-08-31 MED ORDER — DOXYCYCLINE HYCLATE 100 MG PO CAPS: 100.0000 mg | ORAL_CAPSULE | Freq: Two times a day (BID) | ORAL | 8 refills | Status: DC

## 2016-08-31 NOTE — ED Triage Notes (Signed)
Patient presents to ED via POV with c/o generalized abdominal pain that started last night. Patient states she has lost count of how many times she has vomited. Patient denies diarrhea.

## 2016-08-31 NOTE — ED Provider Notes (Signed)
[facility] Emergency Department Provider Note  ____________________________________________   I have reviewed the triage vital signs and the nursing notes.   HISTORY  Chief Complaint Abdominal Pain   History limited by: Not Limited   HPI Ceriah J Roney Sawyer is a 26 y.o. female who presents to the emergency department today because of concerns for abdominal pain. It started yesterday. She describes it as being severe. It is located in the suprapubic region and radiates through the right groin. She states that it caused her not to sleep last night. This has been accompanied by frequent urges for urination. She denies any abnormal vaginal discharge. Did have unprotected intercourse a couple of weeks ago. Patient denies any fevers. Denies other symptoms in the past.   History reviewed. No pertinent past medical history.  Patient Active Problem List   Diagnosis Date Noted  . Acetaminophen overdose 06/27/2015    Past Surgical History:  Procedure Laterality Date  . HAND SURGERY      Prior to Admission medications   Medication Sig Start Date End Date Taking? Authorizing Provider  acetaminophen-codeine 120-12 MG/5ML suspension Take 10 mLs by mouth every 6 (six) hours as needed for pain. 04/16/16 04/16/17  Chinita Pesterari B Triplett, FNP  clindamycin (CLEOCIN) 300 MG capsule Take 1 capsule (300 mg total) by mouth 3 (three) times daily. 06/29/15   Shaune PollackQing Chen, MD  ibuprofen (ADVIL,MOTRIN) 800 MG tablet Take 1 tablet (800 mg total) by mouth every 8 (eight) hours as needed. 05/19/15   Chinita Pesterari B Triplett, FNP  metroNIDAZOLE (FLAGYL) 500 MG tablet Take 1 tablet (500 mg total) by mouth 2 (two) times daily. 02/08/16   Irean HongJade J Sung, MD  oxyCODONE (ROXICODONE) 5 MG immediate release tablet Take 1 tablet (5 mg total) by mouth every 6 (six) hours as needed for severe pain. 06/29/15   Shaune PollackQing Chen, MD    Allergies Valium [diazepam]  Family History  Problem Relation Age of Onset  . Diabetes Mother   .  Asthma Brother   . Hodgkin's lymphoma Maternal Aunt     Social History Social History  Substance Use Topics  . Smoking status: Former Smoker    Quit date: 03/01/2015  . Smokeless tobacco: Never Used  . Alcohol use No    Review of Systems  Constitutional: Negative for fever. Cardiovascular: Negative for chest pain. Respiratory: Negative for shortness of breath. Gastrointestinal: Positive for right lower and suprapubic pain. Genitourinary: Positive for dysuria. Musculoskeletal: Positive for right lower back pain. Neurological: Negative for headaches, focal weakness or numbness.  10-point ROS otherwise negative.  ____________________________________________   PHYSICAL EXAM:  VITAL SIGNS: ED Triage Vitals  Enc Vitals Group     BP 08/31/16 1248 135/71     Pulse Rate 08/31/16 1248 99     Resp 08/31/16 1248 16     Temp 08/31/16 1248 98.6 F (37 C)     Temp Source 08/31/16 1248 Oral     SpO2 08/31/16 1248 100 %     Weight 08/31/16 1249 (!) 333 lb (151 kg)     Height 08/31/16 1249 5\' 10"  (1.778 m)     Head Circumference --      Peak Flow --      Pain Score 08/31/16 1250 10   Constitutional: Alert and oriented. Well appearing and in no distress. Eyes: Conjunctivae are normal. Normal extraocular movements. ENT   Head: Normocephalic and atraumatic.   Nose: No congestion/rhinnorhea.   Mouth/Throat: Mucous membranes are moist.   Neck: No  stridor. Hematological/Lymphatic/Immunilogical: No cervical lymphadenopathy. Cardiovascular: Normal rate, regular rhythm.  No murmurs, rubs, or gallops. Respiratory: Normal respiratory effort without tachypnea nor retractions. Breath sounds are clear and equal bilaterally. No wheezes/rales/rhonchi. Gastrointestinal: Soft and tender in suprapubic and right lower quadrant. No rebound. No guarding.  Genitourinary: No external lesions. Positive CMT, positive right adnexal pain. Musculoskeletal: Normal range of motion in all  extremities. No lower extremity edema. Neurologic:  Normal speech and language. No gross focal neurologic deficits are appreciated.  Skin:  Skin is warm, dry and intact. No rash noted. Psychiatric: Mood and affect are normal. Speech and behavior are normal. Patient exhibits appropriate insight and judgment.  ____________________________________________    LABS (pertinent positives/negatives)  Labs Reviewed  COMPREHENSIVE METABOLIC PANEL - Abnormal; Notable for the following:       Result Value   Total Protein 9.3 (*)    All other components within normal limits  CBC - Abnormal; Notable for the following:    RBC 5.61 (*)    Hemoglobin 11.4 (*)    MCV 64.1 (*)    MCH 20.3 (*)    MCHC 31.6 (*)    RDW 18.5 (*)    All other components within normal limits  URINALYSIS, COMPLETE (UACMP) WITH MICROSCOPIC - Abnormal; Notable for the following:    Color, Urine YELLOW (*)    APPearance HAZY (*)    Hgb urine dipstick SMALL (*)    Ketones, ur 5 (*)    Protein, ur 100 (*)    Leukocytes, UA TRACE (*)    Squamous Epithelial / LPF 6-30 (*)    All other components within normal limits  LIPASE, BLOOD  POCT PREGNANCY, URINE     ____________________________________________   EKG  None  ____________________________________________    RADIOLOGY  US pelvis IMPRESSION: 1. Negative for ovarian torsion 2. Thickened heterogenous endometrium with multiple cystic areas measuring up to 20.2 mm. Endometrial thickness is considered abnormal. Consider follow-up by [place] in 6-8 weeks, during the week immediately following menses (exam timing is critical).  ____________________________________________   PROCEDURES  Procedures  ____________________________________________   INITIAL IMPRESSION / ASSESSMENT AND PLAN / ED COURSE  Pertinent labs & imaging results that were available during my care of the patient were reviewed by me and considered in my medical decision making (see chart for  details).  Patient presented to the emergency department today with concerns for abdominal pain. Pelvic exam was concerning for some cervical motion tenderness and right adnexal pain. Because of this patient was treated for PID. Furthermore pelvic ultrasound was performed which did show some irregularity with endometrium. I did discuss this finding with patient importance of following up with primary care for repeat ultrasound. Patient will be given prescription for doxycycline as well as Flagyl.  ____________________________________________   FINAL CLINICAL IMPRESSION(S) / ED DIAGNOSES  Final diagnoses:  Pelvic pain  Abdominal pain, unspecified abdominal location  Bacterial vaginitis  PID (acute pelvic inflammatory disease)     Note: This dictation was prepared with Dragon dictation. Any transcriptional errors that result from this process are unintentional     Phineas Semen, MD 08/31/16 [phone]

## 2016-08-31 NOTE — Discharge Instructions (Signed)
Please obtain a follow-up pelvic ultrasound in 6-8 weeks, during the week immediately following menses (exam timing is critical).Please seek medical attention for any high fevers, chest pain, shortness of breath, change in behavior, persistent vomiting, bloody stool or any other new or concerning symptoms.

## 2016-09-11 IMAGING — CT CT ABD-PELV W/ CM
2 of 4 series · 17 of 46 positions shown, 19 images · IV contrast (omnipaque)
Comparison: September 12, 2012

CLINICAL DATA: Intermittent right lower quadrant pain for 1 year.

EXAM:
CT ABDOMEN AND PELVIS WITH CONTRAST
TECHNIQUE: Multidetector CT imaging of the abdomen and pelvis was performed
using the standard protocol following bolus administration of
intravenous contrast.
CONTRAST:  125 mL Omnipaque 300

[Series 2: routine abd pel with · axial · 0.87mm/px · z∈[-536,-76]mm · 14 of 102 slices shown, 16 images]
[im 5/102  soft-tissue]
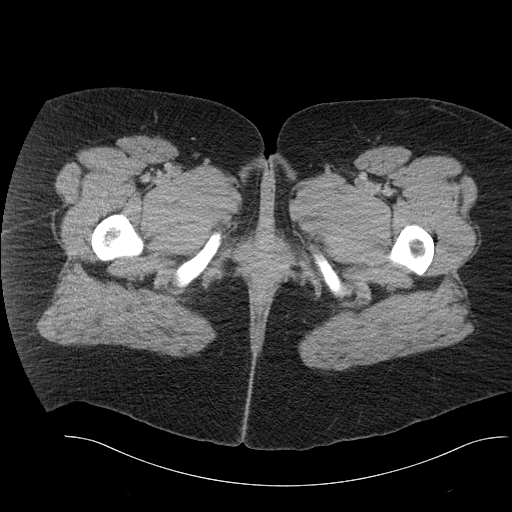
[im 5/102  bone]
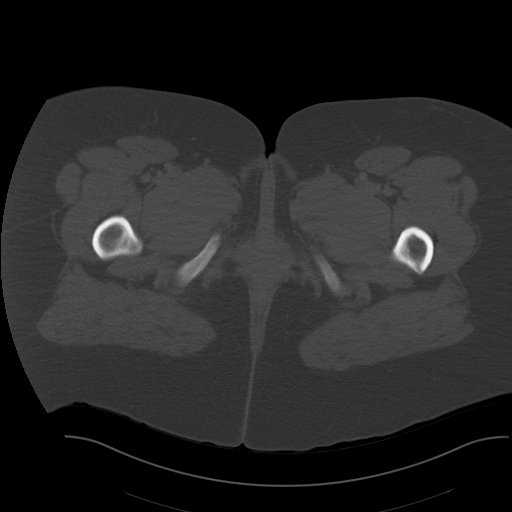
[im 13/102  soft-tissue]
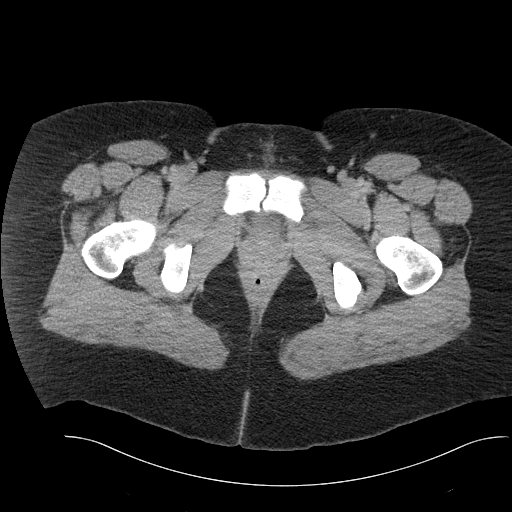
[im 21/102  soft-tissue]
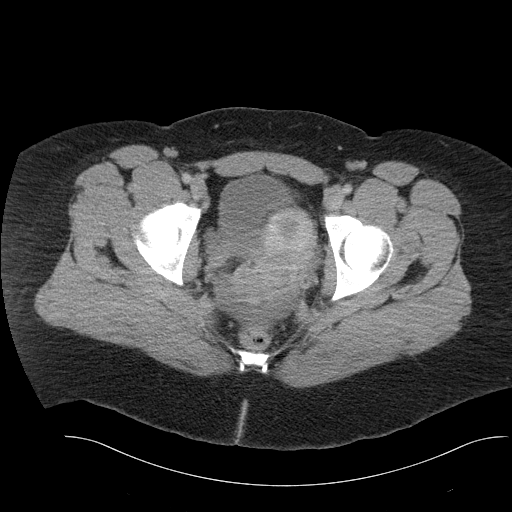
[im 29/102  soft-tissue]
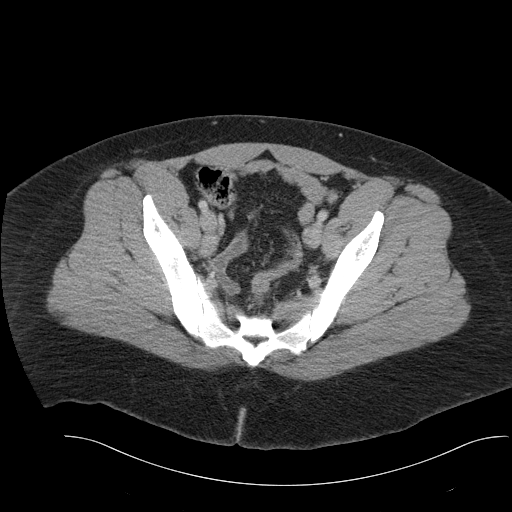
[im 33/102  soft-tissue]
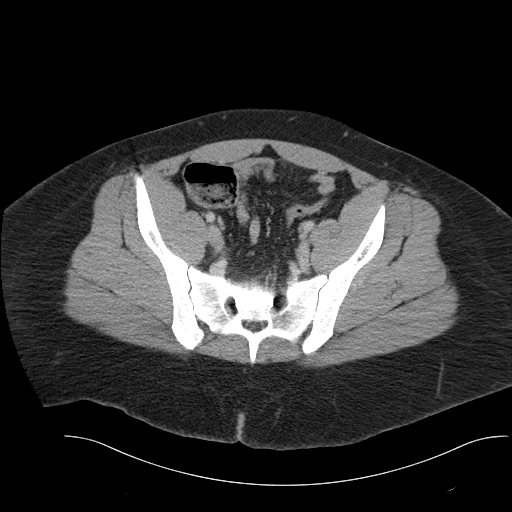
[im 41/102  soft-tissue]
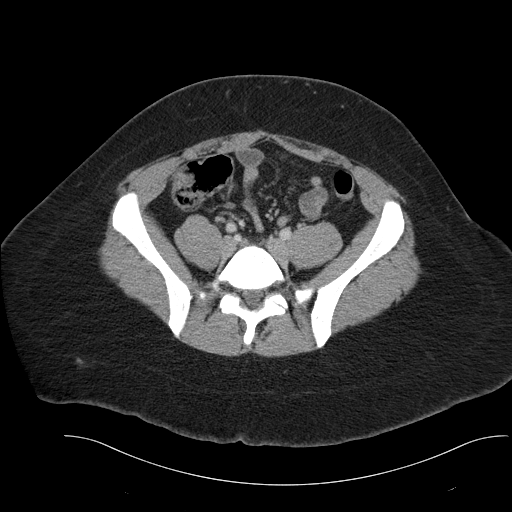
[im 49/102  soft-tissue]
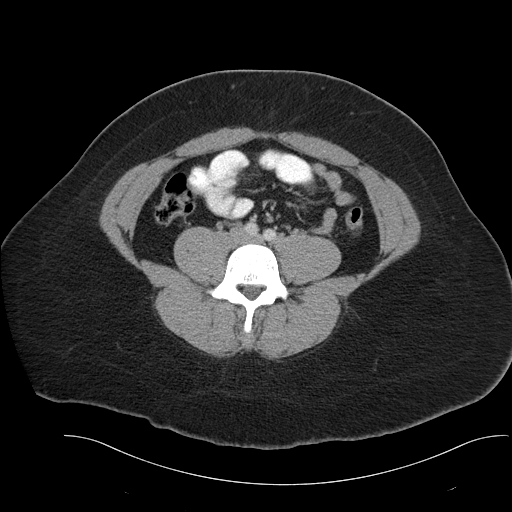
[im 53/102  soft-tissue]
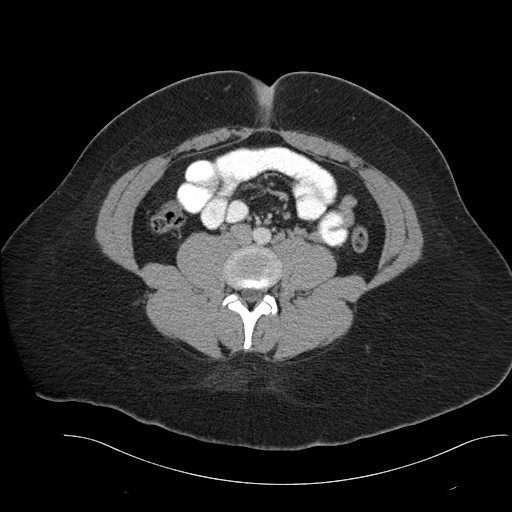
[im 61/102  soft-tissue]
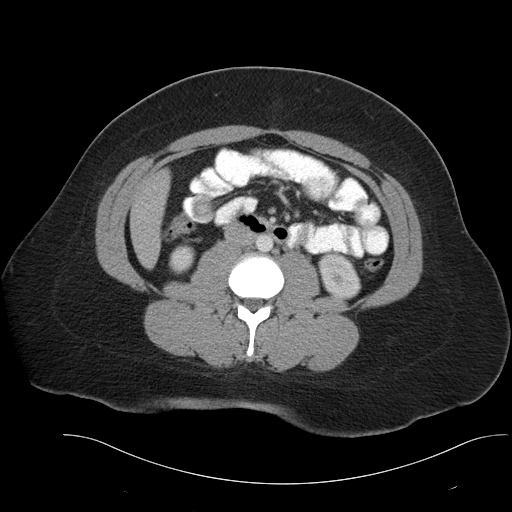
[im 61/102  bone]
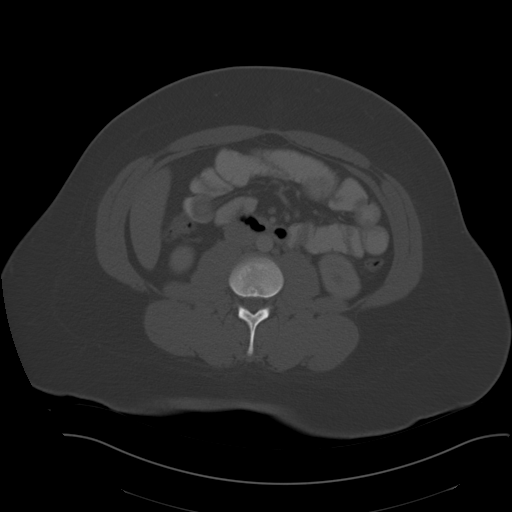
[im 69/102  soft-tissue]
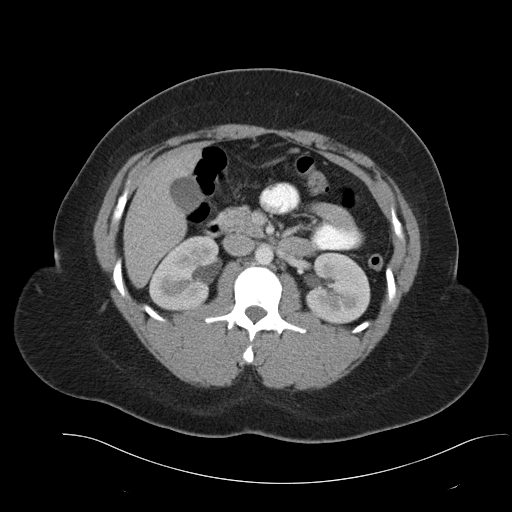
[im 77/102  soft-tissue]
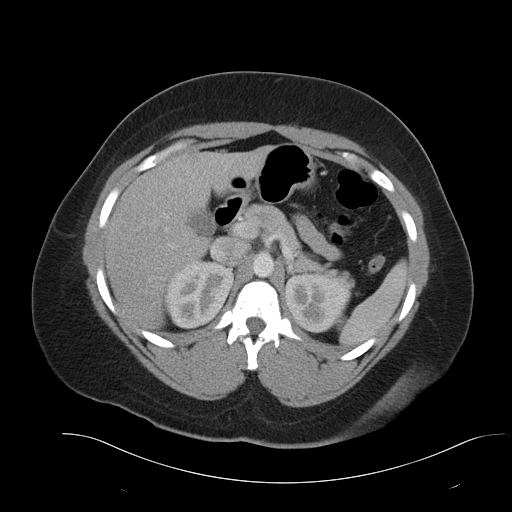
[im 81/102  soft-tissue]
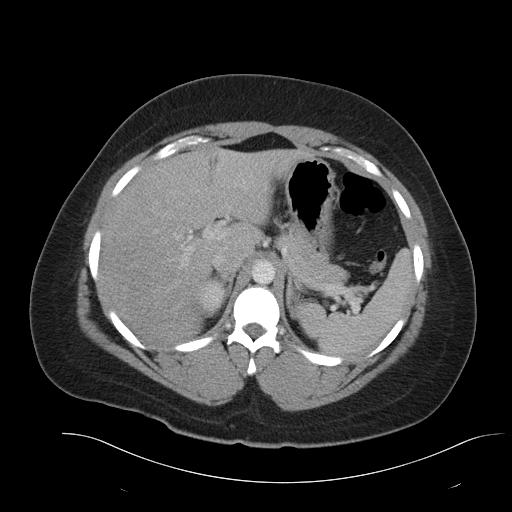
[im 89/102  soft-tissue]
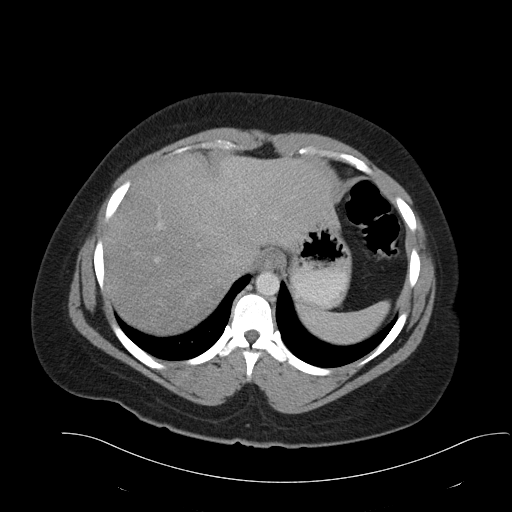
[im 97/102  soft-tissue]
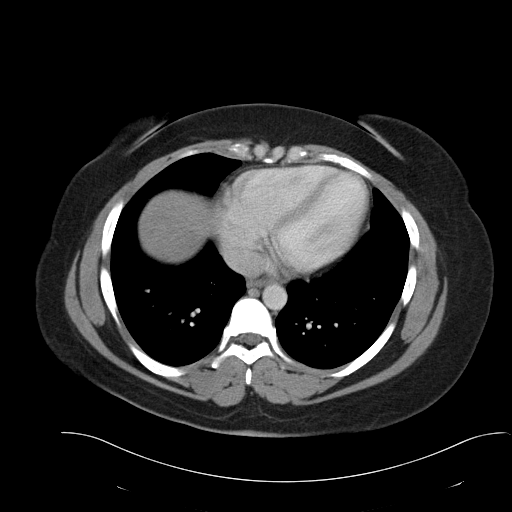

[Series 5: cor routine abd pel with · coronal · 0.88mm/px · 3 of 125 slices shown]
[im 42/125  soft-tissue]
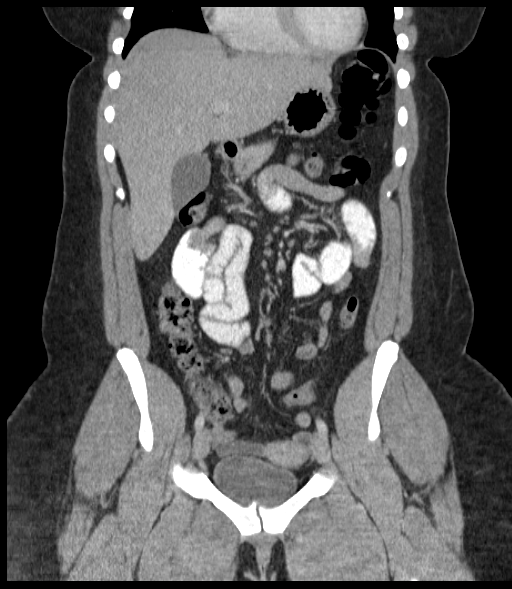
[im 56/125  soft-tissue]
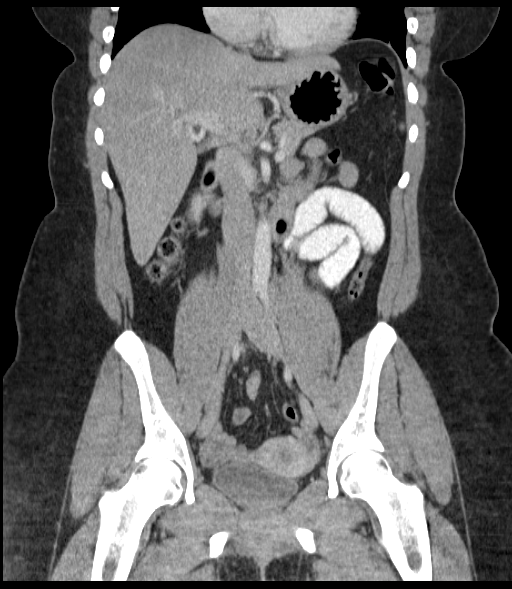
[im 69/125  soft-tissue]
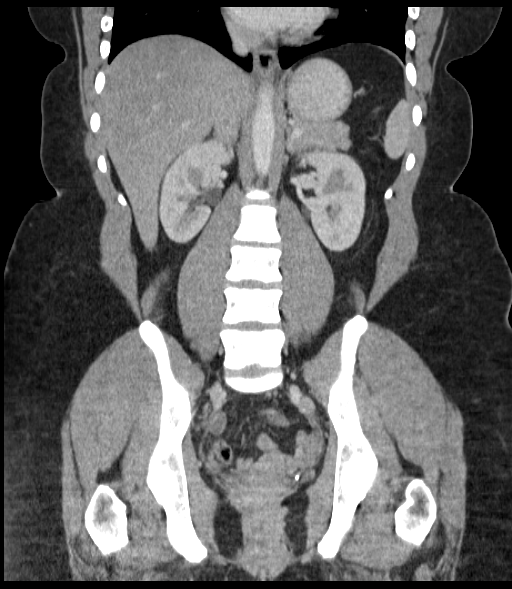

[17 of 46 positions shown; findings below may reference images not displayed]

FINDINGS: There is mild diffuse low density of liver without vessel
displacement consistent with fatty infiltration of liver. No focal
liver lesion is identified. The spleen, pancreas, gallbladder,
adrenal glands and kidneys are normal. The aorta is normal. There is
no abdominal lymphadenopathy. There is no small bowel obstruction or
diverticulitis. The appendix is normal.

Partial fluid-filled bladder is normal. The fluid is identified
within the uterine cavity normal for patient's age. The bilateral
ovaries are normal in size. There is small amount of free fluid in
the pelvis, likely physiologic. The visualized lung bases are clear.
No acute abnormality is identified within the visualized bones.
IMPRESSION: No acute abnormality identified in the abdomen and pelvis. The
appendix is normal.

Small amount of free fluid in the pelvis, likely physiologic.

## 2016-10-28 IMAGING — CR RIGHT ANKLE - COMPLETE 3+ VIEW
1 series · 3 of 3 positions shown · non-contrast
Comparison: None.

CLINICAL DATA: Pain medial malleolus following fall

EXAM:
RIGHT ANKLE - COMPLETE 3+ VIEW

[Series 1: ap · 0.17mm/px · 3 of 3 slices shown]
[im 1/3]
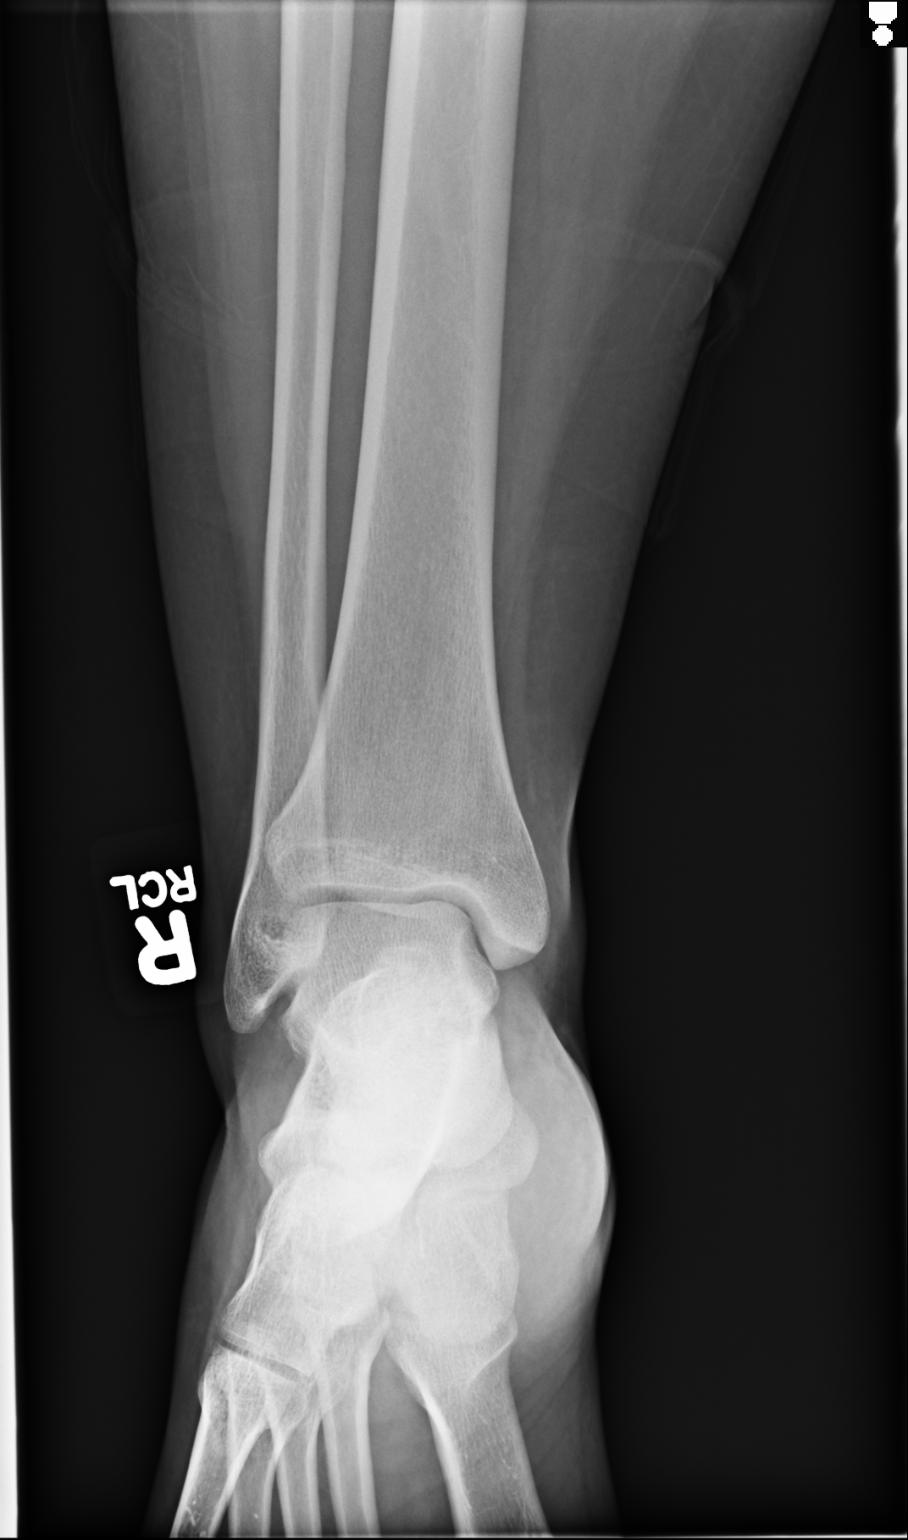
[im 2/3]
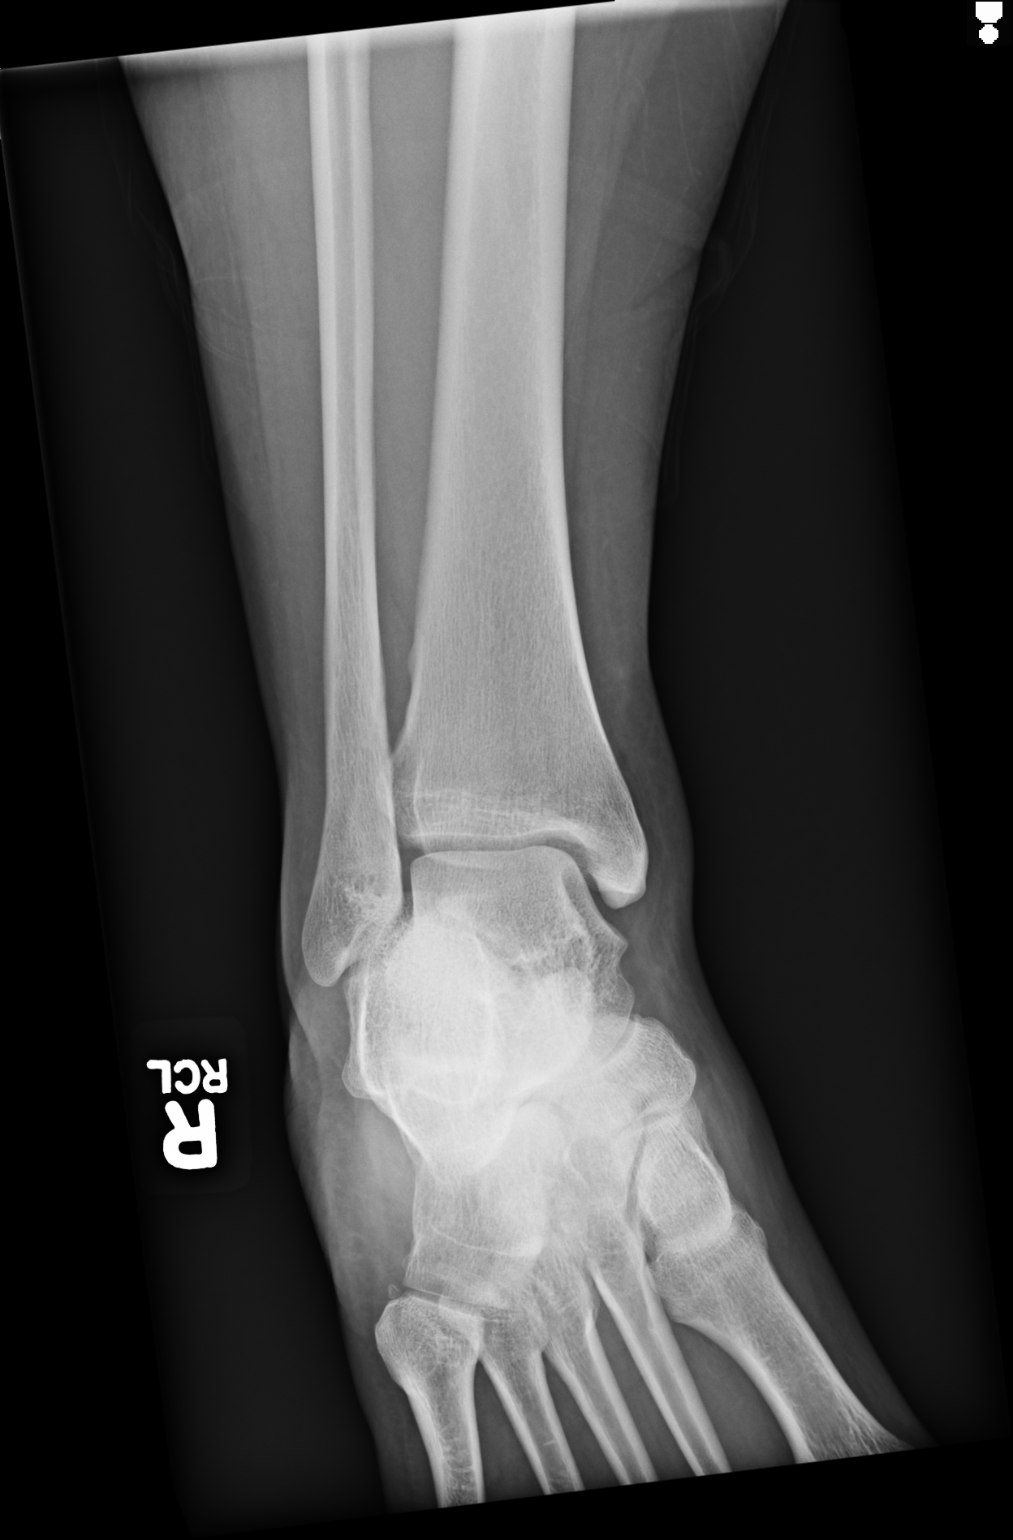
[im 3/3]
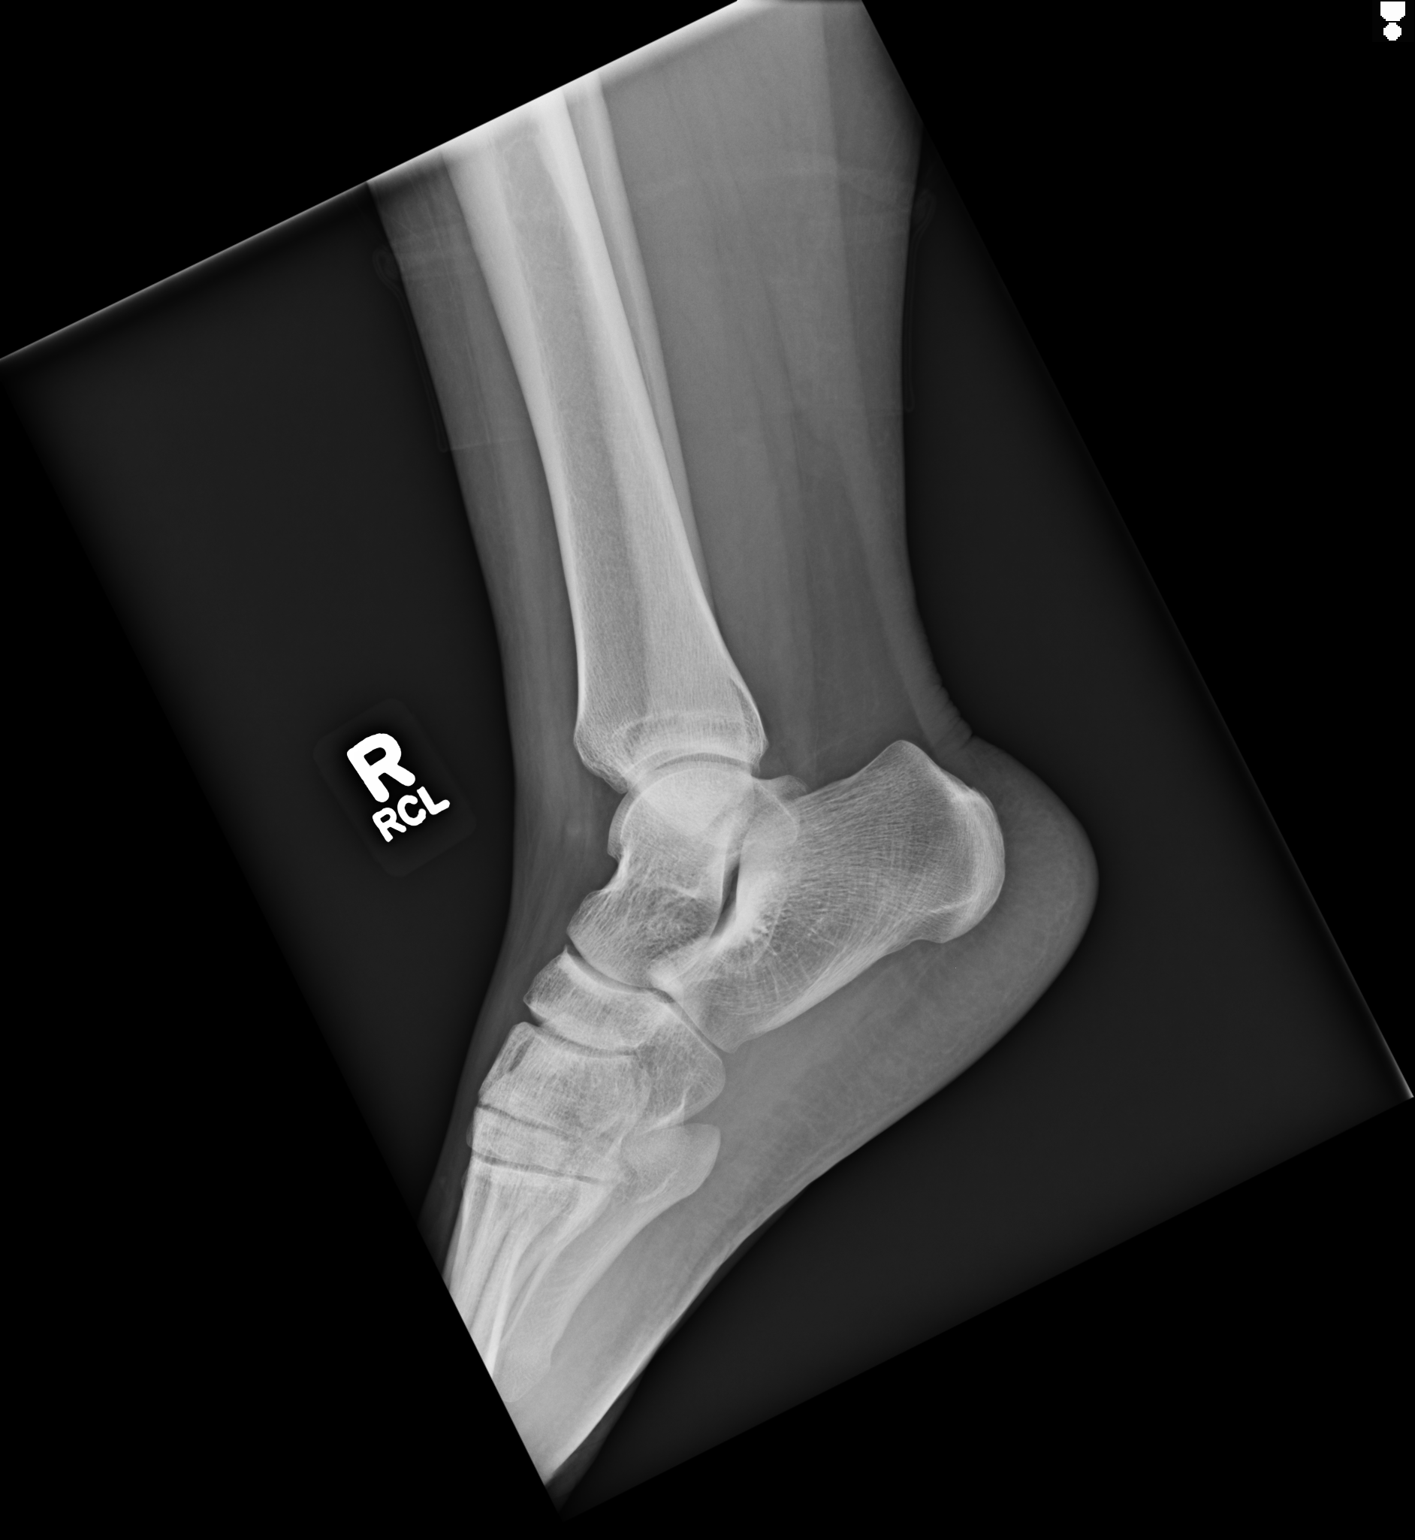

[3 of 3 positions shown; findings below may reference images not displayed]

FINDINGS: Frontal, oblique, and lateral views were obtained. There is no
fracture or joint effusion. Ankle mortise appears intact. No erosive
change.
IMPRESSION: No fracture.  Mortise intact.

## 2016-12-17 ENCOUNTER — Emergency Department: Payer: Self-pay

## 2016-12-17 DIAGNOSIS — F17200 Nicotine dependence, unspecified, uncomplicated: Secondary | ICD-10-CM | POA: Insufficient documentation

## 2016-12-17 DIAGNOSIS — W06XXXA Fall from bed, initial encounter: Secondary | ICD-10-CM | POA: Insufficient documentation

## 2016-12-17 DIAGNOSIS — Y999 Unspecified external cause status: Secondary | ICD-10-CM | POA: Insufficient documentation

## 2016-12-17 DIAGNOSIS — Y92003 Bedroom of unspecified non-institutional (private) residence as the place of occurrence of the external cause: Secondary | ICD-10-CM | POA: Insufficient documentation

## 2016-12-17 DIAGNOSIS — S93401A Sprain of unspecified ligament of right ankle, initial encounter: Secondary | ICD-10-CM | POA: Insufficient documentation

## 2016-12-17 DIAGNOSIS — Y939 Activity, unspecified: Secondary | ICD-10-CM | POA: Insufficient documentation

## 2016-12-17 NOTE — ED Triage Notes (Signed)
Pt to ED via POV with c/o RIGHT ankle pain r/t injury PTA. Pt reports her foot was "asleep when I got out of bed" and fell. Pt denies head injury or LOC. No obvious swelling or deformity observed, skin is WPD, CMS intact, cap refill <3 secs.

## 2016-12-18 ENCOUNTER — Emergency Department
Admission: EM | Admit: 2016-12-18 | Discharge: 2016-12-18 | Disposition: A | Discharge status: Home / Self Care | Payer: Self-pay | Attending: Emergency Medicine | Admitting: Emergency Medicine

## 2016-12-18 DIAGNOSIS — S93401A Sprain of unspecified ligament of right ankle, initial encounter: Secondary | ICD-10-CM

## 2016-12-18 MED ORDER — OXYCODONE-ACETAMINOPHEN 5-325 MG PO TABS
1.0000 | ORAL_TABLET | Freq: Once | ORAL | Status: AC
  Administered 2016-12-18: 1 via ORAL
  Filled 2016-12-18: qty 1

## 2016-12-18 MED ORDER — ETODOLAC 200 MG PO CAPS: 200.0000 mg | ORAL_CAPSULE | Freq: Three times a day (TID) | ORAL | 0 refills | Status: DC

## 2016-12-18 NOTE — ED Provider Notes (Signed)
[facility] Emergency Department Provider Note   ____________________________________________   First MD Initiated Contact with Patient 12/18/16 0128     (approximate)  I have reviewed the triage vital signs and the nursing notes.   HISTORY  Chief Complaint Ankle Pain    HPI Linda Sawyer is a 26 y.o. female who comes into the hospital today with some ankle pain. The patient reports that she jumped out of bed thinking that she had to go to work and fell on her right ankle. She reports that this occurred around 8 or 8:30 PM. The patient did not ice or taken anything for pain. She reports that her pain is currently a 9 out of 10 in intensity. The patient has injured her ankle in the past. She reports a bit swollen and hurts to move. The patient decided to come into the hospital today for evaluation.   History reviewed. No pertinent past medical history.  Patient Active Problem List   Diagnosis Date Noted  . Acetaminophen overdose 06/27/2015    Past Surgical History:  Procedure Laterality Date  . HAND SURGERY      Prior to Admission medications   Medication Sig Start Date End Date Taking? Authorizing Provider  acetaminophen-codeine 120-12 MG/5ML suspension Take 10 mLs by mouth every 6 (six) hours as needed for pain. 04/16/16 04/16/17  Triplett, Kasandra Knudsen, FNP  clindamycin (CLEOCIN) 300 MG capsule Take 1 capsule (300 mg total) by mouth 3 (three) times daily. 06/29/15   Shaune Pollack, MD  doxycycline (VIBRAMYCIN) 100 MG capsule Take 1 capsule (100 mg total) by mouth 2 (two) times daily. 08/31/16   Phineas Semen, MD  etodolac (LODINE) 200 MG capsule Take 1 capsule (200 mg total) by mouth every 8 (eight) hours. 12/18/16   Rebecka Apley, MD  ibuprofen (ADVIL,MOTRIN) 800 MG tablet Take 1 tablet (800 mg total) by mouth every 8 (eight) hours as needed. 05/19/15   Triplett, Rulon Eisenmenger B, FNP  metroNIDAZOLE (FLAGYL) 500 MG tablet Take 1 tablet (500 mg total) by mouth 2  (two) times daily. 02/08/16   Irean Hong, MD  oxyCODONE (ROXICODONE) 5 MG immediate release tablet Take 1 tablet (5 mg total) by mouth every 6 (six) hours as needed for severe pain. 06/29/15   Shaune Pollack, MD    Allergies Valium [diazepam]  Family History  Problem Relation Age of Onset  . Diabetes Mother   . Asthma Brother   . Hodgkin's lymphoma Maternal Aunt     Social History Social History  Substance Use Topics  . Smoking status: Former Smoker    Quit date: 03/01/2015  . Smokeless tobacco: Never Used  . Alcohol use No    Review of Systems  Constitutional: No fever/chills Eyes: No visual changes. ENT: No sore throat. Cardiovascular: Denies chest pain. Respiratory: Denies shortness of breath. Gastrointestinal: No abdominal pain.  No nausea, no vomiting.  No diarrhea.  No constipation. Genitourinary: Negative for dysuria. Musculoskeletal: Right ankle pain to palpation. Skin: Negative for rash. Neurological: Negative for headaches, focal weakness or numbness.   ____________________________________________   PHYSICAL EXAM:  VITAL SIGNS: ED Triage Vitals  Enc Vitals Group     BP 12/17/16 2241 118/75     Pulse Rate 12/17/16 2241 83     Resp 12/17/16 2241 16     Temp 12/17/16 2241 99 F (37.2 C)     Temp Source 12/17/16 2241 Oral     SpO2 12/17/16 2241 100 %  Weight 12/17/16 2235 (!) 315 lb (142.9 kg)     Height 12/17/16 2235 5\' 10"  (1.778 m)     Head Circumference --      Peak Flow --      Pain Score 12/17/16 2235 8     Pain Loc --      Pain Edu? --      Excl. in GC? --     Constitutional: Alert and oriented. Well appearing and in no acute distress. Eyes: Conjunctivae are normal. PERRL. EOMI. Head: Atraumatic. Nose: No congestion/rhinnorhea. Mouth/Throat: Mucous membranes are moist.  Oropharynx non-erythematous. Cardiovascular: Normal rate, regular rhythm. Grossly normal heart sounds.  Good peripheral circulation. Respiratory: Normal respiratory  effort.  No retractions. Lungs CTAB. Gastrointestinal: Soft and nontender. No distention. Positive bowel sounds Musculoskeletal: Right sided ankle tenderness to palpation with some pain with passive range of motion and some mild soft tissue swelling. Neurologic:  Normal speech and language.  Skin:  Skin is warm, dry and intact.  Psychiatric: Mood and affect are normal. Speech and behavior are normal.  ____________________________________________   LABS (all labs ordered are listed, but only abnormal results are displayed)  Labs Reviewed - No data to display ____________________________________________  EKG  none ____________________________________________  RADIOLOGY  Dg Ankle Complete Right  Result Date: 12/17/2016 CLINICAL DATA:  Larey SeatFell getting out of bed.  Pain. EXAM: RIGHT ANKLE - COMPLETE 3+ VIEW COMPARISON:  RIGHT foot radiograph December 17, 2015 FINDINGS: There is no evidence of fracture, dislocation, or joint effusion. Tiny corticated bony fragment base of fifth metatarsus. There is no evidence of arthropathy or other focal bone abnormality. Hindfoot soft tissue swelling without subcutaneous gas or radiopaque foreign bodies. IMPRESSION: Soft tissue swelling without acute fracture deformity. Electronically Signed   By: Awilda Metroourtnay  Bloomer M.D.   On: 12/17/2016 23:18    ____________________________________________   PROCEDURES  Procedure(s) performed: None  Procedures  Critical Care performed: No  ____________________________________________   INITIAL IMPRESSION / ASSESSMENT AND PLAN / ED COURSE  Pertinent labs & imaging results that were available during my care of the patient were reviewed by me and considered in my medical decision making (see chart for details).  This is a 26 year old who comes into the hospital today with some ankle pain. The patient had an x-ray which did not show any fracture but did show some soft tissue swelling. The patient likely has some  ligamentous injury. The patient reports she also had a sprain in the past on the same ankle. I will place the patient in a splint and give her some crutches and encouraged her to rest ice and elevate her ankle. The patient will be discharged home to follow-up with orthopedic surgery. The patient did receive some percocet for pain.      ____________________________________________   FINAL CLINICAL IMPRESSION(S) / ED DIAGNOSES  Final diagnoses:  Sprain of right ankle, unspecified ligament, initial encounter      NEW MEDICATIONS STARTED DURING THIS VISIT:  Discharge Medication List as of 12/18/2016  1:37 AM    START taking these medications   Details  etodolac (LODINE) 200 MG capsule Take 1 capsule (200 mg total) by mouth every 8 (eight) hours., Starting Mon 12/18/2016, Print         Note:  This document was prepared using Dragon voice recognition software and may include unintentional dictation errors.    Rebecka ApleyWebster, Ajwa Kimberley P, MD 12/18/16 [phone]

## 2016-12-18 NOTE — Discharge Instructions (Signed)
Please follow-up with orthopedic surgery for further evaluation. Please keep her ankle elevated and nonweightbearing. Please continue to ice for 20 minute intervals until follow-up.

## 2017-07-10 LAB — HM PAP SMEAR: HM Pap smear: NEGATIVE

## 2017-07-26 ENCOUNTER — Other Ambulatory Visit: Payer: Self-pay

## 2017-07-26 DIAGNOSIS — J029 Acute pharyngitis, unspecified: Secondary | ICD-10-CM | POA: Insufficient documentation

## 2017-07-26 DIAGNOSIS — Z87891 Personal history of nicotine dependence: Secondary | ICD-10-CM | POA: Insufficient documentation

## 2017-07-26 DIAGNOSIS — B349 Viral infection, unspecified: Secondary | ICD-10-CM | POA: Insufficient documentation

## 2017-07-26 NOTE — ED Triage Notes (Signed)
Pt arrives to ED with c/o sore throat x1 day. Pt reports a "white spot on it this morning". Pt states nephew recently d/x'd with Strep. Pt is A&O, in NAD; RR even, regular, and unlabored.

## 2017-07-27 ENCOUNTER — Other Ambulatory Visit: Payer: Self-pay

## 2017-07-27 ENCOUNTER — Emergency Department: Payer: Self-pay

## 2017-07-27 ENCOUNTER — Emergency Department
Admission: EM | Admit: 2017-07-27 | Discharge: 2017-07-27 | Disposition: A | Discharge status: Home / Self Care | Payer: Self-pay | Attending: Emergency Medicine | Admitting: Emergency Medicine

## 2017-07-27 DIAGNOSIS — J029 Acute pharyngitis, unspecified: Secondary | ICD-10-CM

## 2017-07-27 DIAGNOSIS — B349 Viral infection, unspecified: Secondary | ICD-10-CM

## 2017-07-27 LAB — GROUP A STREP BY PCR: Group A Strep by PCR: NOT DETECTED

## 2017-07-27 MED ORDER — BENZONATATE 100 MG PO CAPS
100.0000 mg | ORAL_CAPSULE | Freq: Once | ORAL | Status: AC
  Administered 2017-07-27: 100 mg via ORAL
  Filled 2017-07-27: qty 1

## 2017-07-27 MED ORDER — BENZONATATE 100 MG PO CAPS: 100.0000 mg | ORAL_CAPSULE | Freq: Three times a day (TID) | ORAL | 0 refills | Status: DC | PRN

## 2017-07-27 NOTE — Discharge Instructions (Signed)

## 2017-07-27 NOTE — ED Provider Notes (Signed)
[facility] Emergency Department Provider Note  ____________________________________________   First MD Initiated Contact with Patient 07/27/17 [phone]     (approximate)  I have reviewed the triage vital signs and the nursing notes.   HISTORY  Chief Complaint Sore Throat    HPI Linda Sawyer is a 27 y.o. female with no chronic medical history who presents for evaluation of about 24 hours of sore throat.  She states that it is severe and hurts worse when she tries to eat or drink anything.  Along with the sore throat she has had nasal congestion, runny nose, mild nonproductive cough and general malaise.  Nothing in particular makes her symptoms better.  She thought she saw a white spot on the back of her throat earlier but it went away after she brushed her teeth.  She was concerned because she has a nephew that was recently diagnosed with strep throat.  She denies any difficulty breathing, chest pain, nausea, vomiting, abdominal pain, and dysuria.  History reviewed. No pertinent past medical history.  Patient Active Problem List   Diagnosis Date Noted  . Acetaminophen overdose 06/27/2015    Past Surgical History:  Procedure Laterality Date  . HAND SURGERY      Prior to Admission medications   Medication Sig Start Date End Date Taking? Authorizing Provider  benzonatate (TESSALON PERLES) 100 MG capsule Take 1 capsule (100 mg total) by mouth 3 (three) times daily as needed for cough. Alternatively, let one capsule dissolve on the back of your tongue every 8 hours as needed to soothe your throat. 07/27/17   Loleta Rose, MD  clindamycin (CLEOCIN) 300 MG capsule Take 1 capsule (300 mg total) by mouth 3 (three) times daily. 06/29/15   Shaune Pollack, MD  doxycycline (VIBRAMYCIN) 100 MG capsule Take 1 capsule (100 mg total) by mouth 2 (two) times daily. 08/31/16   Phineas Semen, MD  etodolac (LODINE) 200 MG capsule Take 1 capsule (200 mg total) by mouth every 8  (eight) hours. 12/18/16   Rebecka Apley, MD  ibuprofen (ADVIL,MOTRIN) 800 MG tablet Take 1 tablet (800 mg total) by mouth every 8 (eight) hours as needed. 05/19/15   Triplett, Rulon Eisenmenger B, FNP  metroNIDAZOLE (FLAGYL) 500 MG tablet Take 1 tablet (500 mg total) by mouth 2 (two) times daily. 02/08/16   Irean Hong, MD  oxyCODONE (ROXICODONE) 5 MG immediate release tablet Take 1 tablet (5 mg total) by mouth every 6 (six) hours as needed for severe pain. 06/29/15   Shaune Pollack, MD    Allergies Valium [diazepam]  Family History  Problem Relation Age of Onset  . Diabetes Mother   . Asthma Brother   . Hodgkin's lymphoma Maternal Aunt     Social History Social History   Tobacco Use  . Smoking status: Former Smoker    Last attempt to quit: 03/01/2015    Years since quitting: 2.4  . Smokeless tobacco: Never Used  Substance Use Topics  . Alcohol use: No  . Drug use: No    Review of Systems Constitutional: No fever/chills.  General malaise and fatigue Eyes: No visual changes. ENT: sore throat and nasal congestion Cardiovascular: Denies chest pain. Respiratory: Denies shortness of breath. Gastrointestinal: No abdominal pain.  No nausea, no vomiting.  No diarrhea.  No constipation. Genitourinary: Negative for dysuria. Musculoskeletal: Negative for neck pain.  Negative for back pain. Integumentary: Negative for rash. Neurological: Negative for headaches, focal weakness or numbness.   ____________________________________________   PHYSICAL  EXAM:  VITAL SIGNS: ED Triage Vitals  Enc Vitals Group     BP 07/26/17 2323 (!) 145/80     Pulse Rate 07/26/17 2323 87     Resp 07/26/17 2323 20     Temp 07/26/17 2323 98.8 F (37.1 C)     Temp Source 07/26/17 2323 Oral     SpO2 07/26/17 2323 100 %     Weight 07/26/17 2324 (!) 140.6 kg (310 lb)     Height 07/26/17 2324 1.778 m (5\' 10" )     Head Circumference --      Peak Flow --      Pain Score 07/26/17 2334 10     Pain Loc --      Pain Edu?  --      Excl. in GC? --     Constitutional: Alert and oriented. Well appearing and in no acute distress. Eyes: Conjunctivae are normal.  Head: Atraumatic. Ears:  Healthy appearing ear canals and TMs bilaterally Nose: +congestion/rhinnorhea. Mouth/Throat: Mucous membranes are moist.  Oropharynx erythematous But without exudate and no petechiae  Neck: No stridor.  No meningeal signs.   Cardiovascular: Normal rate, regular rhythm. Good peripheral circulation. Grossly normal heart sounds. Respiratory: Normal respiratory effort.  No retractions. Lungs CTAB. Gastrointestinal: Soft and nontender. No distention.  Musculoskeletal: No lower extremity tenderness nor edema. No gross deformities of extremities. Neurologic:  Normal speech and language. No gross focal neurologic deficits are appreciated.  Ambulatory without difficulty Skin:  Skin is warm, dry and intact. No rash noted. Psychiatric: Mood and affect are normal. Speech and behavior are normal. ____________________________________________   LABS (all labs ordered are listed, but only abnormal results are displayed)  Labs Reviewed  GROUP A STREP BY PCR   ____________________________________________  EKG  None - EKG not ordered by ED physician ____________________________________________  RADIOLOGY  ED MD interpretation:  No imaging ordered  Official radiology report(s): No results found.  ____________________________________________   PROCEDURES  Critical Care performed: No   Procedure(s) performed:   Procedures   ____________________________________________   INITIAL IMPRESSION / ASSESSMENT AND PLAN / ED COURSE  As part of my medical decision making, I reviewed the following data within the electronic MEDICAL RECORD NUMBER Nursing notes reviewed and incorporated and Labs reviewed     patient signs and symptoms are consistent with a viral illness.  She has no sign of bacterial infection.  No evidence of any acute  or emergent condition in her throat such as peritonsillar abscess, Ludwig's angina, etc. I discussed with her the diagnosis of viral syndrome and gave my usual customary recommendations and return precautions.  She understands and agrees with the plan.      ____________________________________________  FINAL CLINICAL IMPRESSION(S) / ED DIAGNOSES  Final diagnoses:  Acute viral syndrome  Sore throat     MEDICATIONS GIVEN DURING THIS VISIT:  Medications  benzonatate (TESSALON) capsule 100 mg (100 mg Oral Given 07/27/17 0442)     ED Discharge Orders        Ordered    benzonatate (TESSALON PERLES) 100 MG capsule  3 times daily PRN     07/27/17 0413       Note:  This document was prepared using Dragon voice recognition software and may include unintentional dictation errors.    Loleta RoseForbach, Ahtziry Saathoff, MD 07/27/17 [phone]

## 2017-07-27 NOTE — ED Notes (Signed)
Pt states that her throat pain is unbearable. Started last night. Pt had nausea and vomiting and HA. Pt reports she cant eat because of the pain.

## 2017-08-03 ENCOUNTER — Other Ambulatory Visit: Payer: Self-pay

## 2017-08-03 ENCOUNTER — Encounter: Payer: Self-pay | Admitting: Emergency Medicine

## 2017-08-03 ENCOUNTER — Emergency Department
Admission: EM | Admit: 2017-08-03 | Discharge: 2017-08-03 | Disposition: A | Discharge status: Home / Self Care | Payer: Self-pay | Attending: Emergency Medicine | Admitting: Emergency Medicine

## 2017-08-03 DIAGNOSIS — Z87891 Personal history of nicotine dependence: Secondary | ICD-10-CM | POA: Insufficient documentation

## 2017-08-03 DIAGNOSIS — H1031 Unspecified acute conjunctivitis, right eye: Secondary | ICD-10-CM

## 2017-08-03 DIAGNOSIS — H10021 Other mucopurulent conjunctivitis, right eye: Secondary | ICD-10-CM | POA: Insufficient documentation

## 2017-08-03 MED ORDER — TOBRAMYCIN 0.3 % OP SOLN: 2.0000 [drp] | OPHTHALMIC | 0 refills | Status: DC

## 2017-08-03 NOTE — ED Triage Notes (Signed)
Pt to ED c/o possible pink eye since yesterday.  States woke up this morning with it "crusted" shut.  Patient sent home from work early.

## 2017-08-03 NOTE — ED Notes (Signed)
NAD noted at time of D/C. Pt denies questions or concerns. Pt ambulatory to the lobby at this time.  

## 2017-08-03 NOTE — Discharge Instructions (Signed)
Follow-up with your regular doctor if you are not better in 3 days.  Use the drops 2 drops 4 times a day in both eyes for 5 days.  You are contagious until you have had 24 hours of drops.  If you are worsening please return the emergency department or follow-up with your regular doctor

## 2017-08-03 NOTE — ED Provider Notes (Signed)
[facility] Emergency Department Provider Note  ____________________________________________   First MD Initiated Contact with Patient 08/03/17 1805     (approximate)  I have reviewed the triage vital signs and the nursing notes.   HISTORY  Chief Complaint Conjunctivitis    HPI Linda Sawyer is a 27 y.o. female who complains of the right eye being pink since yesterday.  She states it was crusted shut this morning.  It has been itchy and burning.  She states several people at work of had pinkeye.  She denies any, cough or congestion.  She had strep throat last week.  She denies any vomiting or diarrhea  History reviewed. No pertinent past medical history.  Patient Active Problem List   Diagnosis Date Noted  . Acetaminophen overdose 06/27/2015    Past Surgical History:  Procedure Laterality Date  . HAND SURGERY      Prior to Admission medications   Medication Sig Start Date End Date Taking? Authorizing Provider  benzonatate (TESSALON PERLES) 100 MG capsule Take 1 capsule (100 mg total) by mouth 3 (three) times daily as needed for cough. Alternatively, let one capsule dissolve on the back of your tongue every 8 hours as needed to soothe your throat. 07/27/17   Loleta RoseForbach, Cory, MD  clindamycin (CLEOCIN) 300 MG capsule Take 1 capsule (300 mg total) by mouth 3 (three) times daily. 06/29/15   Shaune Pollackhen, Qing, MD  doxycycline (VIBRAMYCIN) 100 MG capsule Take 1 capsule (100 mg total) by mouth 2 (two) times daily. 08/31/16   Phineas SemenGoodman, Graydon, MD  etodolac (LODINE) 200 MG capsule Take 1 capsule (200 mg total) by mouth every 8 (eight) hours. 12/18/16   Rebecka ApleyWebster, Allison P, MD  ibuprofen (ADVIL,MOTRIN) 800 MG tablet Take 1 tablet (800 mg total) by mouth every 8 (eight) hours as needed. 05/19/15   Triplett, Rulon Eisenmengerari B, FNP  metroNIDAZOLE (FLAGYL) 500 MG tablet Take 1 tablet (500 mg total) by mouth 2 (two) times daily. 02/08/16   Irean HongSung, Jade J, MD  oxyCODONE (ROXICODONE) 5 MG immediate  release tablet Take 1 tablet (5 mg total) by mouth every 6 (six) hours as needed for severe pain. 06/29/15   Shaune Pollackhen, Qing, MD  tobramycin (TOBREX) 0.3 % ophthalmic solution Place 2 drops into both eyes every 4 (four) hours. For 5 days 08/03/17   Faythe GheeFisher, Eulogio Requena W, PA-C    Allergies Valium [diazepam]  Family History  Problem Relation Age of Onset  . Diabetes Mother   . Asthma Brother   . Hodgkin's lymphoma Maternal Aunt     Social History Social History   Tobacco Use  . Smoking status: Former Smoker    Last attempt to quit: 03/01/2015    Years since quitting: 2.4  . Smokeless tobacco: Never Used  Substance Use Topics  . Alcohol use: No  . Drug use: No    Review of Systems  Constitutional: No fever/chills Eyes: No visual changes.  Positive for redness and crusting in the right eye ENT: No sore throat. Respiratory: Denies cough Genitourinary: Negative for dysuria. Musculoskeletal: Negative for back pain. Skin: Negative for rash.    ____________________________________________   PHYSICAL EXAM:  VITAL SIGNS: ED Triage Vitals  Enc Vitals Group     BP 08/03/17 1747 (!) 132/93     Pulse Rate 08/03/17 1747 80     Resp 08/03/17 1747 16     Temp 08/03/17 1747 98.7 F (37.1 C)     Temp Source 08/03/17 1747 Oral  SpO2 08/03/17 1747 100 %     Weight 08/03/17 1745 (!) 310 lb (140.6 kg)     Height 08/03/17 1745 5\' 10"  (1.778 m)     Head Circumference --      Peak Flow --      Pain Score 08/03/17 1747 0     Pain Loc --      Pain Edu? --      Excl. in GC? --     Constitutional: Alert and oriented. Well appearing and in no acute distress. Eyes: Conjunctivae of the right eye are injected, there is no matting or drainage noted at this time, no foreign body is noted  head: Atraumatic. Nose: No congestion/rhinnorhea. Mouth/Throat: Mucous membranes are moist.   Neck: Is supple, no lymphadenopathy is noted Cardiovascular: Normal rate, regular rhythm.  Heart sounds are  normal Respiratory: Normal respiratory effort.  No retractions, lungs are clear to auscultation GU: deferred Musculoskeletal: FROM all extremities, warm and well perfused Neurologic:  Normal speech and language.  Skin:  Skin is warm, dry and intact. No rash noted. Psychiatric: Mood and affect are normal. Speech and behavior are normal.  ____________________________________________   LABS (all labs ordered are listed, but only abnormal results are displayed)  Labs Reviewed - No data to display ____________________________________________   ____________________________________________  RADIOLOGY    ____________________________________________   PROCEDURES  Procedure(s) performed: No  Procedures    ____________________________________________   INITIAL IMPRESSION / ASSESSMENT AND PLAN / ED COURSE  Pertinent labs & imaging results that were available during my care of the patient were reviewed by me and considered in my medical decision making (see chart for details).  Patient is a 27 year old female who presents emergency department complaining of her right eye being pinkish red since yesterday.  It was crusted shut this morning.  Several people at work of had pinkeye  On physical exam the right eye is injected  Diagnosis is acute bacterial conjunctivitis.  She was given a prescription for tobramycin ophthalmic drops.  She is placed 2 drops into the eye 4 times a day for 5 days.  If the left eye begins to appear infected she can use the same medication.  She was instructed to stay out of work until she has had 24 hours of medication.  She was given a work note also.  She states she understands will comply with the recommendations.  She was discharged in stable condition     As part of my medical decision making, I reviewed the following data within the electronic MEDICAL RECORD NUMBER Nursing notes reviewed and incorporated, Notes from prior ED visits and [place] Controlled  Substance Database  ____________________________________________   FINAL CLINICAL IMPRESSION(S) / ED DIAGNOSES  Final diagnoses:  Acute bacterial conjunctivitis of right eye      NEW MEDICATIONS STARTED DURING THIS VISIT:  New Prescriptions   TOBRAMYCIN (TOBREX) 0.3 % OPHTHALMIC SOLUTION    Place 2 drops into both eyes every 4 (four) hours. For 5 days     Note:  This document was prepared using Dragon voice recognition software and may include unintentional dictation errors.    Faythe Ghee, PA-C 08/03/17 1815    Arnaldo Natal, MD 08/14/17 1124

## 2017-12-19 ENCOUNTER — Encounter: Payer: Self-pay | Admitting: Emergency Medicine

## 2017-12-19 ENCOUNTER — Emergency Department
Admission: EM | Admit: 2017-12-19 | Discharge: 2017-12-19 | Disposition: A | Discharge status: Home / Self Care | Payer: Self-pay | Attending: Emergency Medicine | Admitting: Emergency Medicine

## 2017-12-19 ENCOUNTER — Other Ambulatory Visit: Payer: Self-pay

## 2017-12-19 DIAGNOSIS — Z87891 Personal history of nicotine dependence: Secondary | ICD-10-CM | POA: Insufficient documentation

## 2017-12-19 DIAGNOSIS — H0015 Chalazion left lower eyelid: Secondary | ICD-10-CM | POA: Insufficient documentation

## 2017-12-19 DIAGNOSIS — Z79899 Other long term (current) drug therapy: Secondary | ICD-10-CM | POA: Insufficient documentation

## 2017-12-19 NOTE — ED Provider Notes (Signed)
[facility] Emergency Department Provider Note ____________________________________________  Time seen: 1101  I have reviewed the triage vital signs and the nursing notes.  HISTORY  Chief Complaint  Eye Drainage  HPI Linda Sawyer is a 27 y.o. female presents to the ED for evaluation of left eye irritation for the last couple days.  She describes itching and swelling to the lids of the left eye and reports some crusting upon awakening in the morning.  She denies any eye injury, trauma, visual change, nausea, or vertigo. She does not wear glasses or contacts.   History reviewed. No pertinent past medical history.  Patient Active Problem List   Diagnosis Date Noted  . Acetaminophen overdose 06/27/2015    Past Surgical History:  Procedure Laterality Date  . HAND SURGERY      Prior to Admission medications   Medication Sig Start Date End Date Taking? Authorizing Provider  benzonatate (TESSALON PERLES) 100 MG capsule Take 1 capsule (100 mg total) by mouth 3 (three) times daily as needed for cough. Alternatively, let one capsule dissolve on the back of your tongue every 8 hours as needed to soothe your throat. 07/27/17   Loleta RoseForbach, Cory, MD  clindamycin (CLEOCIN) 300 MG capsule Take 1 capsule (300 mg total) by mouth 3 (three) times daily. 06/29/15   Shaune Pollackhen, Qing, MD  doxycycline (VIBRAMYCIN) 100 MG capsule Take 1 capsule (100 mg total) by mouth 2 (two) times daily. 08/31/16   Phineas SemenGoodman, Graydon, MD  etodolac (LODINE) 200 MG capsule Take 1 capsule (200 mg total) by mouth every 8 (eight) hours. 12/18/16   Rebecka ApleyWebster, Allison P, MD  ibuprofen (ADVIL,MOTRIN) 800 MG tablet Take 1 tablet (800 mg total) by mouth every 8 (eight) hours as needed. 05/19/15   Triplett, Rulon Eisenmengerari B, FNP  metroNIDAZOLE (FLAGYL) 500 MG tablet Take 1 tablet (500 mg total) by mouth 2 (two) times daily. 02/08/16   Irean HongSung, Jade J, MD  oxyCODONE (ROXICODONE) 5 MG immediate release tablet Take 1 tablet (5 mg total) by mouth  every 6 (six) hours as needed for severe pain. 06/29/15   Shaune Pollackhen, Qing, MD  tobramycin (TOBREX) 0.3 % ophthalmic solution Place 2 drops into both eyes every 4 (four) hours. For 5 days 08/03/17   Faythe GheeFisher, Susan W, PA-C    Allergies Valium [diazepam]  Family History  Problem Relation Age of Onset  . Diabetes Mother   . Asthma Brother   . Hodgkin's lymphoma Maternal Aunt     Social History Social History   Tobacco Use  . Smoking status: Former Smoker    Last attempt to quit: 03/01/2015    Years since quitting: 2.8  . Smokeless tobacco: Never Used  Substance Use Topics  . Alcohol use: No  . Drug use: No    Review of Systems  Constitutional: Negative for fever. Eyes: Negative for visual changes. Lid swelling as above.  ENT: Negative for sore throat. Cardiovascular: Negative for chest pain. Respiratory: Negative for shortness of breath. Musculoskeletal: Negative for back pain. Skin: Negative for rash. Neurological: Negative for headaches, focal weakness or numbness. ____________________________________________  PHYSICAL EXAM:  VITAL SIGNS: ED Triage Vitals [12/19/17 1042]  Enc Vitals Group     BP      Pulse      Resp      Temp      Temp src      SpO2      Weight (!) 310 lb (140.6 kg)     Height 5\' 10"  (1.778 m)  Head Circumference      Peak Flow      Pain Score 7     Pain Loc      Pain Edu?      Excl. in GC?     Constitutional: Alert and oriented. Well appearing and in no distress. Head: Normocephalic and atraumatic. Eyes: Conjunctivae are normal. PERRL. Normal extraocular movements. Lower lid with a firm, tender nodule palpable in the below the medial lash margin.  Hematological/Lymphatic/Immunological: No preauricular lymphadenopathy. Cardiovascular: Normal rate, regular rhythm. Normal distal pulses. Respiratory: Normal respiratory effort.  Skin:  Skin is warm, dry and intact. No rash noted. ____________________________________________  INITIAL IMPRESSION  / ASSESSMENT AND PLAN / ED COURSE  Patient with ED evaluation of some left eye swelling and irritation.  Patient's clinical exam reveals a small she will lazy on to the lower lid.  Patient is discharged with instructions on how to manage this self-limited condition.  She is referred to [facility] for any ongoing symptom management.  A work note is also provided for today at the patient's request. ____________________________________________  FINAL CLINICAL IMPRESSION(S) / ED DIAGNOSES  Final diagnoses:  Chalazion of left lower eyelid      Karmen Stabs, Charlesetta Ivory, PA-C 12/19/17 1125    Phineas Semen, MD 12/20/17 [phone]

## 2017-12-19 NOTE — Discharge Instructions (Addendum)
Your exam reveals a small blocked gland in the lower lid. Apply warm compresses to promote healing. Follow-up with [facility] if needed.

## 2017-12-19 NOTE — ED Triage Notes (Signed)
Pt reports drainage from left eye for the past couple of days. States it itches and when she wakes up in the AM it is sealed shut.

## 2017-12-19 NOTE — ED Notes (Signed)
Pt ambulatory to POV without difficulty. VSS. NAD. Discharge instructions, RX and follow up given. All questions addressed.  

## 2018-04-13 ENCOUNTER — Emergency Department: Payer: Self-pay

## 2018-04-13 ENCOUNTER — Encounter: Payer: Self-pay | Admitting: Emergency Medicine

## 2018-04-13 ENCOUNTER — Emergency Department
Admission: EM | Admit: 2018-04-13 | Discharge: 2018-04-13 | Disposition: A | Discharge status: Home / Self Care | Payer: Self-pay | Attending: Emergency Medicine | Admitting: Emergency Medicine

## 2018-04-13 ENCOUNTER — Other Ambulatory Visit: Payer: Self-pay

## 2018-04-13 DIAGNOSIS — M7918 Myalgia, other site: Secondary | ICD-10-CM | POA: Insufficient documentation

## 2018-04-13 DIAGNOSIS — R519 Headache, unspecified: Secondary | ICD-10-CM

## 2018-04-13 DIAGNOSIS — H538 Other visual disturbances: Secondary | ICD-10-CM | POA: Insufficient documentation

## 2018-04-13 DIAGNOSIS — Z87891 Personal history of nicotine dependence: Secondary | ICD-10-CM | POA: Insufficient documentation

## 2018-04-13 DIAGNOSIS — B9789 Other viral agents as the cause of diseases classified elsewhere: Secondary | ICD-10-CM | POA: Insufficient documentation

## 2018-04-13 DIAGNOSIS — R110 Nausea: Secondary | ICD-10-CM | POA: Insufficient documentation

## 2018-04-13 DIAGNOSIS — R42 Dizziness and giddiness: Secondary | ICD-10-CM | POA: Insufficient documentation

## 2018-04-13 DIAGNOSIS — J069 Acute upper respiratory infection, unspecified: Secondary | ICD-10-CM | POA: Insufficient documentation

## 2018-04-13 DIAGNOSIS — R51 Headache: Secondary | ICD-10-CM | POA: Insufficient documentation

## 2018-04-13 LAB — CBC
HCT: 35.5 % — ABNORMAL LOW (ref 36.0–46.0)
Hemoglobin: 10.8 g/dL — ABNORMAL LOW (ref 12.0–15.0)
MCH: 20.7 pg — AB (ref 26.0–34.0)
MCHC: 30.4 g/dL (ref 30.0–36.0)
MCV: 68.1 fL — ABNORMAL LOW (ref 80.0–100.0)
Platelets: 294 10*3/uL (ref 150–400)
RBC: 5.21 MIL/uL — ABNORMAL HIGH (ref 3.87–5.11)
RDW: 18 % — ABNORMAL HIGH (ref 11.5–15.5)
WBC: 7.6 10*3/uL (ref 4.0–10.5)
nRBC: 0 % (ref 0.0–0.2)

## 2018-04-13 LAB — URINE DRUG SCREEN, QUALITATIVE (ARMC ONLY)
AMPHETAMINES, UR SCREEN: NOT DETECTED
Barbiturates, Ur Screen: NOT DETECTED
Benzodiazepine, Ur Scrn: NOT DETECTED
COCAINE METABOLITE, UR ~~LOC~~: NOT DETECTED
Cannabinoid 50 Ng, Ur ~~LOC~~: POSITIVE — AB
MDMA (Ecstasy)Ur Screen: NOT DETECTED
METHADONE SCREEN, URINE: NOT DETECTED
OPIATE, UR SCREEN: NOT DETECTED
Phencyclidine (PCP) Ur S: NOT DETECTED
TRICYCLIC, UR SCREEN: NOT DETECTED

## 2018-04-13 LAB — URINALYSIS, COMPLETE (UACMP) WITH MICROSCOPIC
Bacteria, UA: NONE SEEN
Bilirubin Urine: NEGATIVE
GLUCOSE, UA: NEGATIVE mg/dL
Hgb urine dipstick: NEGATIVE
Ketones, ur: NEGATIVE mg/dL
Leukocytes, UA: NEGATIVE
Nitrite: NEGATIVE
PH: 7 (ref 5.0–8.0)
Protein, ur: NEGATIVE mg/dL
SPECIFIC GRAVITY, URINE: 1.02 (ref 1.005–1.030)

## 2018-04-13 LAB — COMPREHENSIVE METABOLIC PANEL
ALK PHOS: 67 U/L (ref 38–126)
ALT: 14 U/L (ref 0–44)
ANION GAP: 6 (ref 5–15)
AST: 18 U/L (ref 15–41)
Albumin: 3.6 g/dL (ref 3.5–5.0)
BUN: <5 mg/dL — ABNORMAL LOW (ref 6–20)
CALCIUM: 8.9 mg/dL (ref 8.9–10.3)
CO2: 27 mmol/L (ref 22–32)
Chloride: 105 mmol/L (ref 98–111)
Creatinine, Ser: 0.69 mg/dL (ref 0.44–1.00)
Glucose, Bld: 100 mg/dL — ABNORMAL HIGH (ref 70–99)
Potassium: 3.6 mmol/L (ref 3.5–5.1)
SODIUM: 138 mmol/L (ref 135–145)
TOTAL PROTEIN: 8.1 g/dL (ref 6.5–8.1)
Total Bilirubin: 0.4 mg/dL (ref 0.3–1.2)

## 2018-04-13 LAB — INFLUENZA PANEL BY PCR (TYPE A & B)
INFLAPCR: NEGATIVE
INFLBPCR: NEGATIVE

## 2018-04-13 LAB — ACETAMINOPHEN LEVEL: Acetaminophen (Tylenol), Serum: <10 ug/mL — ABNORMAL LOW (ref 10–30)

## 2018-04-13 LAB — PREGNANCY, URINE: PREG TEST UR: NEGATIVE

## 2018-04-13 LAB — TROPONIN I: Troponin I: <0.03 ng/mL (ref <0.03)

## 2018-04-13 MED ORDER — KETOROLAC TROMETHAMINE 10 MG PO TABS: 10.0000 mg | ORAL_TABLET | Freq: Four times a day (QID) | ORAL | 0 refills | Status: DC | PRN

## 2018-04-13 MED ORDER — MECLIZINE HCL 25 MG PO TABS
25.0000 mg | ORAL_TABLET | Freq: Once | ORAL | Status: AC
  Administered 2018-04-13: 25 mg via ORAL
  Filled 2018-04-13: qty 1

## 2018-04-13 MED ORDER — MECLIZINE HCL 25 MG PO TABS: 25.0000 mg | ORAL_TABLET | Freq: Three times a day (TID) | ORAL | 0 refills | Status: DC | PRN

## 2018-04-13 MED ORDER — KETOROLAC TROMETHAMINE 30 MG/ML IJ SOLN
30.0000 mg | Freq: Once | INTRAMUSCULAR | Status: AC
  Administered 2018-04-13: 30 mg via INTRAVENOUS
  Filled 2018-04-13: qty 1

## 2018-04-13 MED ORDER — FLUTICASONE PROPIONATE 50 MCG/ACT NA SUSP: 2.0000 | Freq: Every day | NASAL | 0 refills | Status: DC

## 2018-04-13 MED ORDER — SODIUM CHLORIDE 0.9 % IV BOLUS
1000.0000 mL | Freq: Once | INTRAVENOUS | Status: AC
  Administered 2018-04-13: 1000 mL via INTRAVENOUS

## 2018-04-13 MED ORDER — PROMETHAZINE HCL 25 MG/ML IJ SOLN
12.5000 mg | Freq: Once | INTRAMUSCULAR | Status: AC
  Administered 2018-04-13: 12.5 mg via INTRAVENOUS
  Filled 2018-04-13: qty 1

## 2018-04-13 MED ORDER — BENZONATATE 100 MG PO CAPS: 100.0000 mg | ORAL_CAPSULE | Freq: Three times a day (TID) | ORAL | 0 refills | Status: DC | PRN

## 2018-04-13 NOTE — ED Triage Notes (Signed)
States congestion and headache since yesterday.

## 2018-04-13 NOTE — ED Notes (Addendum)
See triage note  Presents with nasal congestion and some cough   No fever  Pt is sleepy   But answers questions approp

## 2018-04-13 NOTE — ED Provider Notes (Signed)
[facility] Emergency Department Provider Note  ____________________________________________  Time seen: Approximately 10:55 AM  I have reviewed the triage vital signs and the nursing notes.   HISTORY  Chief Complaint Nasal Congestion    HPI Linda Sawyer is a 27 y.o. female that presents emergency department for evaluation of nasal congestion, body aches, headache, and dizziness for 1 day.  She occasionally has a nonproductive cough.  Patient states that dizziness started after taking Tylenol last night and isnt sure if this is related. She states she took 4 tablets last night and none this morning.    She has a migraine with photophobia and nausea that started this morning. Headache wraps around the front of her head.  She has a history of migraines and this feels the same.  She usually has nausea and photophobia with migraines.   Her brother brought her to the emergency department.  No fever, chills, sore throat, SOB, CP.  History reviewed. No pertinent past medical history.  Patient Active Problem List   Diagnosis Date Noted  . Acetaminophen overdose 06/27/2015    Past Surgical History:  Procedure Laterality Date  . HAND SURGERY      Prior to Admission medications   Medication Sig Start Date End Date Taking? Authorizing Provider  benzonatate (TESSALON PERLES) 100 MG capsule Take 1 capsule (100 mg total) by mouth 3 (three) times daily as needed for cough. 04/13/18 04/13/19  Enid Derry, PA-C  clindamycin (CLEOCIN) 300 MG capsule Take 1 capsule (300 mg total) by mouth 3 (three) times daily. 06/29/15   Shaune Pollack, MD  doxycycline (VIBRAMYCIN) 100 MG capsule Take 1 capsule (100 mg total) by mouth 2 (two) times daily. 08/31/16   Phineas Semen, MD  etodolac (LODINE) 200 MG capsule Take 1 capsule (200 mg total) by mouth every 8 (eight) hours. 12/18/16   Rebecka Apley, MD  fluticasone (FLONASE) 50 MCG/ACT nasal spray Place 2 sprays into both nostrils daily.  04/13/18 04/13/19  Enid Derry, PA-C  ibuprofen (ADVIL,MOTRIN) 800 MG tablet Take 1 tablet (800 mg total) by mouth every 8 (eight) hours as needed. 05/19/15   Triplett, Rulon Eisenmenger B, FNP  ketorolac (TORADOL) 10 MG tablet Take 1 tablet (10 mg total) by mouth every 6 (six) hours as needed. 04/13/18   Enid Derry, PA-C  meclizine (ANTIVERT) 25 MG tablet Take 1 tablet (25 mg total) by mouth 3 (three) times daily as needed for dizziness. 04/13/18   Enid Derry, PA-C  metroNIDAZOLE (FLAGYL) 500 MG tablet Take 1 tablet (500 mg total) by mouth 2 (two) times daily. 02/08/16   Irean Hong, MD  oxyCODONE (ROXICODONE) 5 MG immediate release tablet Take 1 tablet (5 mg total) by mouth every 6 (six) hours as needed for severe pain. 06/29/15   Shaune Pollack, MD  tobramycin (TOBREX) 0.3 % ophthalmic solution Place 2 drops into both eyes every 4 (four) hours. For 5 days 08/03/17   Faythe Ghee, PA-C    Allergies Valium [diazepam]  Family History  Problem Relation Age of Onset  . Diabetes Mother   . Asthma Brother   . Hodgkin's lymphoma Maternal Aunt     Social History Social History   Tobacco Use  . Smoking status: Former Smoker    Last attempt to quit: 03/01/2015    Years since quitting: 3.1  . Smokeless tobacco: Never Used  Substance Use Topics  . Alcohol use: No  . Drug use: No     Review of Systems  Constitutional: No fever/chills ENT: Positive for nasal congestion. Cardiovascular: No chest pain. Respiratory: Positive for cough. No SOB. Gastrointestinal: No abdominal pain.  No vomiting.  Skin: Negative for rash, abrasions, lacerations, ecchymosis. Neurological: Negative for numbness or tingling   ____________________________________________   PHYSICAL EXAM:  VITAL SIGNS: ED Triage Vitals  Enc Vitals Group     BP 04/13/18 0954 133/89     Pulse Rate 04/13/18 0954 75     Resp 04/13/18 0954 18     Temp 04/13/18 0954 (!) 97.5 F (36.4 C)     Temp Source 04/13/18 0954 Oral     SpO2  04/13/18 0954 100 %     Weight 04/13/18 0955 300 lb (136.1 kg)     Height 04/13/18 0955 5\' 10"  (1.778 m)     Head Circumference --      Peak Flow --      Pain Score 04/13/18 0955 10     Pain Loc --      Pain Edu? --      Excl. in GC? --      Constitutional: Alert and oriented. Sleepy.  Eyes: Conjunctivae are normal. PERRL. EOMI. Head: Atraumatic. ENT:      Ears: Tympanic membranes are pearly.      Nose: Moderate congestion/rhinnorhea.  Patient actively sneezing and blowing nose.      Mouth/Throat: Mucous membranes are moist.  Oropharynx nonerythematous.  Tonsils not enlarged. Neck: No stridor.  Cardiovascular: Normal rate, regular rhythm.  Good peripheral circulation. Respiratory: Normal respiratory effort without tachypnea or retractions. Lungs CTAB. Good air entry to the bases with no decreased or absent breath sounds. Musculoskeletal: Full range of motion to all extremities. No gross deformities appreciated. Neurologic:  Normal speech and language. No gross focal neurologic deficits are appreciated.  Skin:  Skin is warm, dry and intact. No rash noted. Psychiatric: Mood and affect are normal. Speech and behavior are normal. Patient exhibits appropriate insight and judgement.   ____________________________________________   LABS (all labs ordered are listed, but only abnormal results are displayed)  Labs Reviewed  CBC - Abnormal; Notable for the following components:      Result Value   RBC 5.21 (*)    Hemoglobin 10.8 (*)    HCT 35.5 (*)    MCV 68.1 (*)    MCH 20.7 (*)    RDW 18.0 (*)    All other components within normal limits  COMPREHENSIVE METABOLIC PANEL - Abnormal; Notable for the following components:   Glucose, Bld 100 (*)    BUN <5 (*)    All other components within normal limits  URINALYSIS, COMPLETE (UACMP) WITH MICROSCOPIC - Abnormal; Notable for the following components:   Color, Urine YELLOW (*)    APPearance HAZY (*)    All other components within  normal limits  ACETAMINOPHEN LEVEL - Abnormal; Notable for the following components:   Acetaminophen (Tylenol), Serum <10 (*)    All other components within normal limits  URINE DRUG SCREEN, QUALITATIVE ([facility] ONLY) - Abnormal; Notable for the following components:   Cannabinoid 50 Ng, Ur [place] POSITIVE (*)    All other components within normal limits  INFLUENZA PANEL BY PCR (TYPE A & B)  TROPONIN I  PREGNANCY, URINE  POC URINE PREG, ED   ____________________________________________  EKG  NSR ____________________________________________  RADIOLOGY Lexine Baton, personally viewed and evaluated these images (plain radiographs) as part of my medical decision making, as well as reviewing the written report by the radiologist.  Dg  Chest 2 View  Result Date: 04/13/2018 CLINICAL DATA:  Nasal congestion and cough. EXAM: CHEST - 2 VIEW COMPARISON:  None. FINDINGS: The heart is at the upper limits of normal in size. Normal mediastinal contours. Normal pulmonary vascularity. No focal consolidation, pleural effusion, or pneumothorax. No acute osseous abnormality. IMPRESSION: No active cardiopulmonary disease. Electronically Signed   By: Obie Dredge M.D.   On: 04/13/2018 11:52    ____________________________________________    PROCEDURES  Procedure(s) performed:    Procedures    Medications  sodium chloride 0.9 % bolus 1,000 mL (0 mLs Intravenous Stopped 04/13/18 1455)  ketorolac (TORADOL) 30 MG/ML injection 30 mg (30 mg Intravenous Given 04/13/18 1328)  promethazine (PHENERGAN) injection 12.5 mg (12.5 mg Intravenous Given 04/13/18 1328)  meclizine (ANTIVERT) tablet 25 mg (25 mg Oral Given 04/13/18 1328)     ____________________________________________   INITIAL IMPRESSION / ASSESSMENT AND PLAN / ED COURSE  Pertinent labs & imaging results that were available during my care of the patient were reviewed by me and considered in my medical decision making (see chart for  details).  Review of the [place] CSRS was performed in accordance of the [facility] prior to dispensing any controlled drugs.    Patient's diagnosis is consistent with viral URI and migraine.  Vital signs and exam are reassuring. Influenza is negative. Acetaminophen level was ordered as patient has had acetaminophen overdose in the past.  Acetaminophen less than 10.  Normal LFTs.  CBC consistent with previous.  Headache and dizziness significantly improved with Fluids, Toradol, Phenergan, meclizine.  EKG was discussed with Dr. Fanny Bien and he recommends add on of troponin to labwork.  After discussion of patient's history and exam, Dr. Fanny Bien is agreeable with plan of care of discharge after negative troponin.  Patient is given ED precautions to return to the ED for any worsening or new symptoms.   ____________________________________________  FINAL CLINICAL IMPRESSION(S) / ED DIAGNOSES  Final diagnoses:  Viral URI with cough  Acute intractable headache, unspecified headache type      NEW MEDICATIONS STARTED DURING THIS VISIT:  ED Discharge Orders         Ordered    benzonatate (TESSALON PERLES) 100 MG capsule  3 times daily PRN     04/13/18 1438    fluticasone (FLONASE) 50 MCG/ACT nasal spray  Daily     04/13/18 1438    ketorolac (TORADOL) 10 MG tablet  Every 6 hours PRN     04/13/18 1438    meclizine (ANTIVERT) 25 MG tablet  3 times daily PRN     04/13/18 1438              This chart was dictated using voice recognition software/Dragon. Despite best efforts to proofread, errors can occur which can change the meaning. Any change was purely unintentional.    Enid Derry, PA-C 04/13/18 1532    Sharyn Creamer, MD 04/13/18 2043

## 2018-04-13 NOTE — ED Provider Notes (Signed)
EKG reviewed and entered by me at 1155 Heart rate 85 QRS 90 QT 400 Normal sinus rhythm, nonspecific T wave abnormality is seen in V1 V2 and V3, also some slight inversion in aVF area no evidence of ST elevation.  Compared to the patient's previous seems slightly different with respect to inversion slightly more evident.    Sharyn Creamer, MD 04/13/18 1158

## 2018-06-10 ENCOUNTER — Emergency Department
Admission: EM | Admit: 2018-06-10 | Discharge: 2018-06-10 | Disposition: A | Discharge status: Home / Self Care | Payer: Medicaid Other | Attending: Student in an Organized Health Care Education/Training Program | Admitting: Student in an Organized Health Care Education/Training Program

## 2018-06-10 ENCOUNTER — Other Ambulatory Visit: Payer: Self-pay

## 2018-06-10 ENCOUNTER — Encounter: Payer: Self-pay | Admitting: Emergency Medicine

## 2018-06-10 DIAGNOSIS — J101 Influenza due to other identified influenza virus with other respiratory manifestations: Secondary | ICD-10-CM

## 2018-06-10 DIAGNOSIS — Z87891 Personal history of nicotine dependence: Secondary | ICD-10-CM | POA: Insufficient documentation

## 2018-06-10 LAB — INFLUENZA PANEL BY PCR (TYPE A & B)
INFLAPCR: NEGATIVE
INFLBPCR: POSITIVE — AB

## 2018-06-10 MED ORDER — HYDROCOD POLST-CPM POLST ER 10-8 MG/5ML PO SUER
5.0000 mL | Freq: Once | ORAL | Status: AC
Start: 2018-06-10 — End: 2018-06-10
  Administered 2018-06-10: 5 mL via ORAL
  Filled 2018-06-10: qty 5

## 2018-06-10 MED ORDER — OSELTAMIVIR PHOSPHATE 75 MG PO CAPS: 75.0000 mg | ORAL_CAPSULE | Freq: Two times a day (BID) | ORAL | 0 refills | Status: AC

## 2018-06-10 MED ORDER — IBUPROFEN 600 MG PO TABS: 600.0000 mg | ORAL_TABLET | Freq: Three times a day (TID) | ORAL | 0 refills | Status: DC | PRN

## 2018-06-10 MED ORDER — KETOROLAC TROMETHAMINE 60 MG/2ML IM SOLN
60.0000 mg | Freq: Once | INTRAMUSCULAR | Status: AC
  Administered 2018-06-10: 60 mg via INTRAMUSCULAR
  Filled 2018-06-10: qty 2

## 2018-06-10 MED ORDER — PSEUDOEPH-BROMPHEN-DM 30-2-10 MG/5ML PO SYRP: 5.0000 mL | ORAL_SOLUTION | Freq: Four times a day (QID) | ORAL | 0 refills | Status: DC | PRN

## 2018-06-10 NOTE — ED Notes (Signed)
See triage note  Presents with 3 days of cough   States cough has been intermittently prod  Subjective fever  Having some discomfort to chest and rib d/t cough  States she has been vomiting d/t cough  Afebrile on arrival

## 2018-06-10 NOTE — ED Provider Notes (Signed)
[facility] Emergency Department Provider Note   ____________________________________________   First MD Initiated Contact with Patient 06/10/18 [phone]     (approximate)  I have reviewed the triage vital signs and the nursing notes.   HISTORY  Chief Complaint Cough and Emesis    HPI Linda Sawyer is a 27 y.o. female patient presents for 4 days of cough, body aches, vomiting secondary to coughing spells, and nasal congestion.  Patient rates her pain discomfort as 8/10.  Patient scribed pain is "achy".  No palliative measure for complaint.  Patient had taken a flu shot for this season.  There are other members in the family that are sick.  History reviewed. No pertinent past medical history.  Patient Active Problem List   Diagnosis Date Noted  . Acetaminophen overdose 06/27/2015    Past Surgical History:  Procedure Laterality Date  . HAND SURGERY      Prior to Admission medications   Medication Sig Start Date End Date Taking? Authorizing Provider  benzonatate (TESSALON PERLES) 100 MG capsule Take 1 capsule (100 mg total) by mouth 3 (three) times daily as needed for cough. 04/13/18 04/13/19  Enid DerryWagner, Ashley, PA-C  brompheniramine-pseudoephedrine-DM 30-2-10 MG/5ML syrup Take 5 mLs by mouth 4 (four) times daily as needed. 06/10/18   Joni ReiningSmith,  K, PA-C  clindamycin (CLEOCIN) 300 MG capsule Take 1 capsule (300 mg total) by mouth 3 (three) times daily. 06/29/15   Shaune Pollackhen, Qing, MD  doxycycline (VIBRAMYCIN) 100 MG capsule Take 1 capsule (100 mg total) by mouth 2 (two) times daily. 08/31/16   Phineas SemenGoodman, Graydon, MD  etodolac (LODINE) 200 MG capsule Take 1 capsule (200 mg total) by mouth every 8 (eight) hours. 12/18/16   Rebecka ApleyWebster, Allison P, MD  fluticasone (FLONASE) 50 MCG/ACT nasal spray Place 2 sprays into both nostrils daily. 04/13/18 04/13/19  Enid DerryWagner, Ashley, PA-C  ibuprofen (ADVIL,MOTRIN) 600 MG tablet Take 1 tablet (600 mg total) by mouth every 8 (eight) hours as  needed. 06/10/18   Joni ReiningSmith,  K, PA-C  ibuprofen (ADVIL,MOTRIN) 800 MG tablet Take 1 tablet (800 mg total) by mouth every 8 (eight) hours as needed. 05/19/15   Triplett, Rulon Eisenmengerari B, FNP  ketorolac (TORADOL) 10 MG tablet Take 1 tablet (10 mg total) by mouth every 6 (six) hours as needed. 04/13/18   Enid DerryWagner, Ashley, PA-C  meclizine (ANTIVERT) 25 MG tablet Take 1 tablet (25 mg total) by mouth 3 (three) times daily as needed for dizziness. 04/13/18   Enid DerryWagner, Ashley, PA-C  metroNIDAZOLE (FLAGYL) 500 MG tablet Take 1 tablet (500 mg total) by mouth 2 (two) times daily. 02/08/16   Irean HongSung, Jade J, MD  oseltamivir (TAMIFLU) 75 MG capsule Take 1 capsule (75 mg total) by mouth 2 (two) times daily for 5 days. 06/10/18 06/15/18  Joni ReiningSmith,  K, PA-C  oxyCODONE (ROXICODONE) 5 MG immediate release tablet Take 1 tablet (5 mg total) by mouth every 6 (six) hours as needed for severe pain. 06/29/15   Shaune Pollackhen, Qing, MD  tobramycin (TOBREX) 0.3 % ophthalmic solution Place 2 drops into both eyes every 4 (four) hours. For 5 days 08/03/17   Faythe GheeFisher, Susan W, PA-C    Allergies Valium [diazepam]  Family History  Problem Relation Age of Onset  . Diabetes Mother   . Asthma Brother   . Hodgkin's lymphoma Maternal Aunt     Social History Social History   Tobacco Use  . Smoking status: Former Smoker    Last attempt to quit: 03/01/2015  Years since quitting: 3.2  . Smokeless tobacco: Never Used  Substance Use Topics  . Alcohol use: No  . Drug use: No    Review of Systems Constitutional: No fever/chills Eyes: No visual changes. ENT: No sore throat.  Nasal congestion. Cardiovascular: Denies chest pain. Respiratory: Denies shortness of breath.  Nonproductive cough. Gastrointestinal: No abdominal pain.  No nausea, no vomiting.  No diarrhea.  No constipation. Genitourinary: Negative for dysuria. Musculoskeletal: Body aches and chest wall pain secondary to coughing.. Skin: Negative for rash. Neurological: Negative for  headaches, focal weakness or numbness. Allergic/Immunilogical: Valium. ____________________________________________   PHYSICAL EXAM:  VITAL SIGNS: ED Triage Vitals  Enc Vitals Group     BP 06/10/18 0834 (!) 120/91     Pulse Rate 06/10/18 0834 95     Resp 06/10/18 0834 20     Temp 06/10/18 0834 98.2 F (36.8 C)     Temp Source 06/10/18 0834 Oral     SpO2 --      Weight 06/10/18 0835 (!) 340 lb (154.2 kg)     Height 06/10/18 0835 5\' 10"  (1.778 m)     Head Circumference --      Peak Flow --      Pain Score 06/10/18 0855 8     Pain Loc --      Pain Edu? --      Excl. in GC? --     Constitutional: Alert and oriented. Well appearing and in no acute distress.  Morbid obesity. Nose: Edematous nasal turbinates clear rhinorrhea. Mouth/Throat: Mucous membranes are moist.  Oropharynx non-erythematous.  Postnasal drainage. Neck: No stridor.  Hematological/Lymphatic/Immunilogical: No cervical lymphadenopathy. Cardiovascular: Normal rate, regular rhythm. Grossly normal heart sounds.  Good peripheral circulation. Respiratory: Normal respiratory effort.  No retractions. Lungs mild wheezing.  Increased cough with deep inspiration. Gastrointestinal: Soft and nontender. No distention. No abdominal bruits. No CVA tenderness. Neurologic:  Normal speech and language. No gross focal neurologic deficits are appreciated. No gait instability. Skin:  Skin is warm, dry and intact. No rash noted. Psychiatric: Mood and affect are normal. Speech and behavior are normal.  ____________________________________________   LABS (all labs ordered are listed, but only abnormal results are displayed)  Labs Reviewed  INFLUENZA PANEL BY PCR (TYPE A & B) - Abnormal; Notable for the following components:      Result Value   Influenza B By PCR POSITIVE (*)    All other components within normal limits    ____________________________________________  EKG   ____________________________________________  RADIOLOGY  ED MD interpretation:    Official radiology report(s): No results found.  ____________________________________________   PROCEDURES  Procedure(s) performed: None  Procedures  Critical Care performed: No  ____________________________________________   INITIAL IMPRESSION / ASSESSMENT AND PLAN / ED COURSE  As part of my medical decision making, I reviewed the following data within the electronic MEDICAL RECORD NUMBER    Patient presents with 3 days of cough.  Patient states cough been intimating productive nonproductive.  Patient said body aches and fever.  Patient was positive for influenza B.  Patient given discharge care instruction advised take medication as directed.  Patient advised [facility].      ____________________________________________   FINAL CLINICAL IMPRESSION(S) / ED DIAGNOSES  Final diagnoses:  Influenza B     ED Discharge Orders         Ordered    oseltamivir (TAMIFLU) 75 MG capsule  2 times daily     06/10/18 1036    ibuprofen (  ADVIL,MOTRIN) 600 MG tablet  Every 8 hours PRN     06/10/18 1036    brompheniramine-pseudoephedrine-DM 30-2-10 MG/5ML syrup  4 times daily PRN     06/10/18 1036           Note:  This document was prepared using Dragon voice recognition software and may include unintentional dictation errors.    Joni Reining, PA-C 06/10/18 1038    Willy Eddy, MD 06/10/18 [phone]

## 2018-06-10 NOTE — ED Notes (Signed)
First Nurse Note: Patient states after she coughed she has had chest and side pain.  Holding right side.  Speaking in full sentences, adult mask on.

## 2018-06-10 NOTE — ED Triage Notes (Signed)
Pt here with c/o cough for the past few days, states she has been vomiting due to coughing so much. Denies fever, appears in NAD.

## 2018-06-26 ENCOUNTER — Emergency Department: Payer: Medicaid Other

## 2018-06-26 ENCOUNTER — Emergency Department
Admission: EM | Admit: 2018-06-26 | Discharge: 2018-06-26 | Disposition: A | Discharge status: Home / Self Care | Payer: Medicaid Other | Attending: Emergency Medicine | Admitting: Emergency Medicine

## 2018-06-26 ENCOUNTER — Other Ambulatory Visit: Payer: Self-pay

## 2018-06-26 ENCOUNTER — Encounter: Payer: Self-pay | Admitting: Emergency Medicine

## 2018-06-26 DIAGNOSIS — J069 Acute upper respiratory infection, unspecified: Secondary | ICD-10-CM | POA: Insufficient documentation

## 2018-06-26 DIAGNOSIS — J0100 Acute maxillary sinusitis, unspecified: Secondary | ICD-10-CM | POA: Insufficient documentation

## 2018-06-26 DIAGNOSIS — Z87891 Personal history of nicotine dependence: Secondary | ICD-10-CM | POA: Insufficient documentation

## 2018-06-26 DIAGNOSIS — Z79899 Other long term (current) drug therapy: Secondary | ICD-10-CM | POA: Insufficient documentation

## 2018-06-26 LAB — GROUP A STREP BY PCR: Group A Strep by PCR: NOT DETECTED

## 2018-06-26 LAB — INFLUENZA PANEL BY PCR (TYPE A & B)
Influenza A By PCR: NEGATIVE
Influenza B By PCR: NEGATIVE

## 2018-06-26 MED ORDER — AZITHROMYCIN 250 MG PO TABS: ORAL_TABLET | ORAL | 0 refills | Status: AC

## 2018-06-26 MED ORDER — AZITHROMYCIN 500 MG PO TABS
500.0000 mg | ORAL_TABLET | Freq: Once | ORAL | Status: AC
  Administered 2018-06-26: 500 mg via ORAL
  Filled 2018-06-26: qty 1

## 2018-06-26 MED ORDER — HYDROCOD POLST-CPM POLST ER 10-8 MG/5ML PO SUER
5.0000 mL | Freq: Once | ORAL | Status: AC
  Administered 2018-06-26: 5 mL via ORAL
  Filled 2018-06-26: qty 5

## 2018-06-26 NOTE — ED Triage Notes (Signed)
Patient ambulatory to triage with steady gait, without difficulty or distress noted; pt reports generalized HA x 2 days accomp by sore throat and fever; no meds taken PTA

## 2018-06-26 NOTE — ED Provider Notes (Signed)
[facility] Emergency Department Provider Note    First MD Initiated Contact with Patient 06/26/18 0308     (approximate)  I have reviewed the triage vital signs and the nursing notes.   HISTORY  Chief Complaint Headache    HPI Linda Sawyer is a 28 y.o. female presents to the emergency department the 2-day history of sore throat headache and fever.   History reviewed. No pertinent past medical history.  Patient Active Problem List   Diagnosis Date Noted  . Acetaminophen overdose 06/27/2015    Past Surgical History:  Procedure Laterality Date  . HAND SURGERY      Prior to Admission medications   Medication Sig Start Date End Date Taking? Authorizing Provider  azithromycin (ZITHROMAX Z-PAK) 250 MG tablet Take 2 tablets (500 mg) on  Day 1,  followed by 1 tablet (250 mg) once daily on Days 2 through 5. 06/26/18 07/01/18  Darci Current, MD  benzonatate (TESSALON PERLES) 100 MG capsule Take 1 capsule (100 mg total) by mouth 3 (three) times daily as needed for cough. 04/13/18 04/13/19  Enid Derry, PA-C  brompheniramine-pseudoephedrine-DM 30-2-10 MG/5ML syrup Take 5 mLs by mouth 4 (four) times daily as needed. 06/10/18   Joni Reining, PA-C  clindamycin (CLEOCIN) 300 MG capsule Take 1 capsule (300 mg total) by mouth 3 (three) times daily. 06/29/15   Shaune Pollack, MD  doxycycline (VIBRAMYCIN) 100 MG capsule Take 1 capsule (100 mg total) by mouth 2 (two) times daily. 08/31/16   Phineas Semen, MD  etodolac (LODINE) 200 MG capsule Take 1 capsule (200 mg total) by mouth every 8 (eight) hours. 12/18/16   Rebecka Apley, MD  fluticasone (FLONASE) 50 MCG/ACT nasal spray Place 2 sprays into both nostrils daily. 04/13/18 04/13/19  Enid Derry, PA-C  ibuprofen (ADVIL,MOTRIN) 600 MG tablet Take 1 tablet (600 mg total) by mouth every 8 (eight) hours as needed. 06/10/18   Joni Reining, PA-C  ibuprofen (ADVIL,MOTRIN) 800 MG tablet Take 1 tablet (800 mg total)  by mouth every 8 (eight) hours as needed. 05/19/15   Triplett, Rulon Eisenmenger B, FNP  ketorolac (TORADOL) 10 MG tablet Take 1 tablet (10 mg total) by mouth every 6 (six) hours as needed. 04/13/18   Enid Derry, PA-C  meclizine (ANTIVERT) 25 MG tablet Take 1 tablet (25 mg total) by mouth 3 (three) times daily as needed for dizziness. 04/13/18   Enid Derry, PA-C  metroNIDAZOLE (FLAGYL) 500 MG tablet Take 1 tablet (500 mg total) by mouth 2 (two) times daily. 02/08/16   Irean Hong, MD  oxyCODONE (ROXICODONE) 5 MG immediate release tablet Take 1 tablet (5 mg total) by mouth every 6 (six) hours as needed for severe pain. 06/29/15   Shaune Pollack, MD  tobramycin (TOBREX) 0.3 % ophthalmic solution Place 2 drops into both eyes every 4 (four) hours. For 5 days 08/03/17   Faythe Ghee, PA-C    Allergies Valium [diazepam]  Family History  Problem Relation Age of Onset  . Diabetes Mother   . Asthma Brother   . Hodgkin's lymphoma Maternal Aunt     Social History Social History   Tobacco Use  . Smoking status: Former Smoker    Last attempt to quit: 03/01/2015    Years since quitting: 3.3  . Smokeless tobacco: Never Used  Substance Use Topics  . Alcohol use: No  . Drug use: No    Review of Systems Constitutional: Positive for fever/chills Eyes: No visual changes.  ENT: Positive for sore throat. Cardiovascular: Denies chest pain. Respiratory: Denies shortness of breath.  Positive for cough Gastrointestinal: No abdominal pain.  No nausea, no vomiting.  No diarrhea.  No constipation. Genitourinary: Negative for dysuria. Musculoskeletal: Negative for neck pain.  Negative for back pain. Integumentary: Negative for rash. Neurological: Negative for headaches, focal weakness or numbness.   ____________________________________________   PHYSICAL EXAM:  VITAL SIGNS: ED Triage Vitals  Enc Vitals Group     BP 06/26/18 0247 128/76     Pulse Rate 06/26/18 0247 (!) 101     Resp 06/26/18 0247 18      Temp 06/26/18 0247 98.9 F (37.2 C)     Temp src --      SpO2 06/26/18 0247 99 %     Weight 06/26/18 0244 (!) 140.6 kg (310 lb)     Height 06/26/18 0244 1.778 m (5\' 10" )     Head Circumference --      Peak Flow --      Pain Score 06/26/18 0244 10     Pain Loc --      Pain Edu? --      Excl. in GC? --     Constitutional: Alert and oriented. Well appearing and in no acute distress. Eyes: Conjunctivae are normal.  Ears:  Healthy appearing ear canals and TMs bilaterally Nose: No congestion/rhinnorhea.  Pain with palpation left maxillary sinus Mouth/Throat: Mucous membranes are moist.  Oropharynx non-erythematous. Neck: No stridor.   Cardiovascular: Normal rate, regular rhythm. Good peripheral circulation. Grossly normal heart sounds. Respiratory: Normal respiratory effort.  No retractions. Lungs CTAB. Gastrointestinal: Soft and nontender. No distention.   Musculoskeletal: No lower extremity tenderness nor edema. No gross deformities of extremities. Neurologic:  Normal speech and language. No gross focal neurologic deficits are appreciated.  Skin:  Skin is warm, dry and intact. No rash noted.   ____________________________________________   LABS (all labs ordered are listed, but only abnormal results are displayed)  Labs Reviewed  GROUP A STREP BY PCR  INFLUENZA PANEL BY PCR (TYPE A & B)    RADIOLOGY I, Kemmerer N Chrystie Hagwood, personally viewed and evaluated these images (plain radiographs) as part of my medical decision making, as well as reviewing the written report by the radiologist.  ED MD interpretation:  Normal Chest xray  Official radiology report(s): Dg Chest 2 View  Result Date: 06/26/2018 CLINICAL DATA:  Headache, sore throat, fever EXAM: CHEST - 2 VIEW COMPARISON:  04/13/2018 FINDINGS: Lungs are clear.  No pleural effusion or pneumothorax. The heart is normal in size. Visualized osseous structures are within normal limits. IMPRESSION: Normal chest radiographs.  Electronically Signed   By: Charline BillsSriyesh  Krishnan M.D.   On: 06/26/2018 03:40      Procedures   ____________________________________________   INITIAL IMPRESSION / ASSESSMENT AND PLAN / ED COURSE  As part of my medical decision making, I reviewed the following data within the electronic MEDICAL RECORD NUMBER 28 year old female presenting with above-stated history consistent with possible upper respiratory tract infection versus pneumonia or bronchitis.  Patient also with symptoms consistent with sinusitis. ____________________________________________  FINAL CLINICAL IMPRESSION(S) / ED DIAGNOSES  Final diagnoses:  Upper respiratory tract infection, unspecified type  Acute maxillary sinusitis, recurrence not specified     MEDICATIONS GIVEN DURING THIS VISIT:  Medications  azithromycin (ZITHROMAX) tablet 500 mg (500 mg Oral Given 06/26/18 0435)  chlorpheniramine-HYDROcodone (TUSSIONEX) 10-8 MG/5ML suspension 5 mL (5 mLs Oral Given 06/26/18 0435)     ED Discharge Orders  Ordered    azithromycin (ZITHROMAX Z-PAK) 250 MG tablet     06/26/18 0449           Note:  This document was prepared using Dragon voice recognition software and may include unintentional dictation errors.    Darci Current, MD 06/30/18 [phone]

## 2018-09-11 ENCOUNTER — Other Ambulatory Visit (HOSPITAL_COMMUNITY): Payer: Self-pay | Admitting: Family Medicine

## 2018-09-11 LAB — HM HIV SCREENING LAB: HM HIV Screening: NEGATIVE

## 2018-09-11 LAB — WET PREP FOR TRICH, YEAST, CLUE
Trichomonas Exam: NEGATIVE
Yeast Exam: NEGATIVE

## 2018-11-01 ENCOUNTER — Emergency Department (HOSPITAL_COMMUNITY): Payer: No Typology Code available for payment source

## 2018-11-01 ENCOUNTER — Emergency Department (HOSPITAL_COMMUNITY)
Admission: EM | Admit: 2018-11-01 | Discharge: 2018-11-01 | Disposition: A | Discharge status: Home / Self Care | Payer: No Typology Code available for payment source | Attending: Emergency Medicine | Admitting: Emergency Medicine

## 2018-11-01 DIAGNOSIS — R1084 Generalized abdominal pain: Secondary | ICD-10-CM | POA: Diagnosis not present

## 2018-11-01 DIAGNOSIS — M79631 Pain in right forearm: Secondary | ICD-10-CM | POA: Insufficient documentation

## 2018-11-01 DIAGNOSIS — S0181XA Laceration without foreign body of other part of head, initial encounter: Secondary | ICD-10-CM | POA: Insufficient documentation

## 2018-11-01 DIAGNOSIS — S0990XA Unspecified injury of head, initial encounter: Secondary | ICD-10-CM | POA: Diagnosis present

## 2018-11-01 DIAGNOSIS — S40212A Abrasion of left shoulder, initial encounter: Secondary | ICD-10-CM | POA: Insufficient documentation

## 2018-11-01 DIAGNOSIS — T07XXXA Unspecified multiple injuries, initial encounter: Secondary | ICD-10-CM

## 2018-11-01 DIAGNOSIS — Z23 Encounter for immunization: Secondary | ICD-10-CM | POA: Insufficient documentation

## 2018-11-01 DIAGNOSIS — Y92410 Unspecified street and highway as the place of occurrence of the external cause: Secondary | ICD-10-CM | POA: Diagnosis not present

## 2018-11-01 DIAGNOSIS — Z87891 Personal history of nicotine dependence: Secondary | ICD-10-CM | POA: Insufficient documentation

## 2018-11-01 DIAGNOSIS — Y999 Unspecified external cause status: Secondary | ICD-10-CM | POA: Diagnosis not present

## 2018-11-01 DIAGNOSIS — S40812A Abrasion of left upper arm, initial encounter: Secondary | ICD-10-CM | POA: Diagnosis not present

## 2018-11-01 DIAGNOSIS — Z79899 Other long term (current) drug therapy: Secondary | ICD-10-CM | POA: Insufficient documentation

## 2018-11-01 DIAGNOSIS — Y9389 Activity, other specified: Secondary | ICD-10-CM | POA: Insufficient documentation

## 2018-11-01 LAB — CBC WITH DIFFERENTIAL/PLATELET
Abs Immature Granulocytes: 0.03 10*3/uL (ref 0.00–0.07)
Basophils Absolute: 0 10*3/uL (ref 0.0–0.1)
Basophils Relative: 0 %
Eosinophils Absolute: 0.1 10*3/uL (ref 0.0–0.5)
Eosinophils Relative: 1 %
HCT: 34.1 % — ABNORMAL LOW (ref 36.0–46.0)
Hemoglobin: 10.5 g/dL — ABNORMAL LOW (ref 12.0–15.0)
Immature Granulocytes: 0 %
Lymphocytes Relative: 28 %
Lymphs Abs: 2 10*3/uL (ref 0.7–4.0)
MCH: 21.1 pg — ABNORMAL LOW (ref 26.0–34.0)
MCHC: 30.8 g/dL (ref 30.0–36.0)
MCV: 68.5 fL — ABNORMAL LOW (ref 80.0–100.0)
Monocytes Absolute: 0.7 10*3/uL (ref 0.1–1.0)
Monocytes Relative: 9 %
Neutro Abs: 4.6 10*3/uL (ref 1.7–7.7)
Neutrophils Relative %: 62 %
Platelets: 288 10*3/uL (ref 150–400)
RBC: 4.98 MIL/uL (ref 3.87–5.11)
RDW: 17.8 % — ABNORMAL HIGH (ref 11.5–15.5)
WBC: 7.4 10*3/uL (ref 4.0–10.5)
nRBC: 0 % (ref 0.0–0.2)

## 2018-11-01 LAB — COMPREHENSIVE METABOLIC PANEL
ALT: 15 U/L (ref 0–44)
AST: 17 U/L (ref 15–41)
Albumin: 3.5 g/dL (ref 3.5–5.0)
Alkaline Phosphatase: 69 U/L (ref 38–126)
Anion gap: 9 (ref 5–15)
BUN: 5 mg/dL — ABNORMAL LOW (ref 6–20)
CO2: 26 mmol/L (ref 22–32)
Calcium: 8.9 mg/dL (ref 8.9–10.3)
Chloride: 101 mmol/L (ref 98–111)
Creatinine, Ser: 0.78 mg/dL (ref 0.44–1.00)
GFR calc Af Amer: >60 mL/min (ref >60)
GFR calc non Af Amer: >60 mL/min (ref >60)
Glucose, Bld: 101 mg/dL — ABNORMAL HIGH (ref 70–99)
Potassium: 3.5 mmol/L (ref 3.5–5.1)
Sodium: 136 mmol/L (ref 135–145)
Total Bilirubin: 0.5 mg/dL (ref 0.3–1.2)
Total Protein: 7.7 g/dL (ref 6.5–8.1)

## 2018-11-01 LAB — I-STAT BETA HCG BLOOD, ED (MC, WL, AP ONLY): I-stat hCG, quantitative: <5 m[IU]/mL (ref <5)

## 2018-11-01 LAB — LIPASE, BLOOD: Lipase: 26 U/L (ref 11–51)

## 2018-11-01 MED ORDER — HYDROCODONE-ACETAMINOPHEN 5-325 MG PO TABS: 1.0000 | ORAL_TABLET | Freq: Four times a day (QID) | ORAL | 0 refills | Status: DC | PRN

## 2018-11-01 MED ORDER — IBUPROFEN 600 MG PO TABS: 600.0000 mg | ORAL_TABLET | Freq: Three times a day (TID) | ORAL | 0 refills | Status: DC | PRN

## 2018-11-01 MED ORDER — MORPHINE SULFATE (PF) 4 MG/ML IV SOLN
6.0000 mg | Freq: Once | INTRAVENOUS | Status: AC
  Administered 2018-11-01: 6 mg via INTRAVENOUS
  Filled 2018-11-01: qty 2

## 2018-11-01 MED ORDER — IOHEXOL 300 MG/ML  SOLN
100.0000 mL | Freq: Once | INTRAMUSCULAR | Status: AC | PRN
  Administered 2018-11-01: 100 mL via INTRAVENOUS

## 2018-11-01 MED ORDER — SODIUM CHLORIDE 0.9 % IV BOLUS
1000.0000 mL | Freq: Once | INTRAVENOUS | Status: AC
  Administered 2018-11-01: 1000 mL via INTRAVENOUS

## 2018-11-01 MED ORDER — HYDROCODONE-ACETAMINOPHEN 5-325 MG PO TABS
2.0000 | ORAL_TABLET | Freq: Once | ORAL | Status: AC
  Administered 2018-11-01: 2 via ORAL
  Filled 2018-11-01: qty 2

## 2018-11-01 MED ORDER — TETANUS-DIPHTH-ACELL PERTUSSIS 5-2.5-18.5 LF-MCG/0.5 IM SUSP
0.5000 mL | Freq: Once | INTRAMUSCULAR | Status: AC
  Administered 2018-11-01: 0.5 mL via INTRAMUSCULAR
  Filled 2018-11-01: qty 0.5

## 2018-11-01 MED ORDER — ONDANSETRON HCL 4 MG/2ML IJ SOLN
4.0000 mg | Freq: Once | INTRAMUSCULAR | Status: AC
  Administered 2018-11-01: 4 mg via INTRAVENOUS
  Filled 2018-11-01: qty 2
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
## 2018-11-01 NOTE — ED Notes (Signed)
ED Provider at bedside. 

## 2018-11-01 NOTE — ED Notes (Signed)
Pt discharged by [facility]

## 2018-11-01 NOTE — ED Notes (Signed)
Patient transported to CT 

## 2018-11-01 NOTE — ED Notes (Signed)
This RN wheeled pt to lobby after Elmarie Shiley, RN discharged pt.
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
## 2018-11-05 ENCOUNTER — Emergency Department
Admission: EM | Admit: 2018-11-05 | Discharge: 2018-11-05 | Disposition: A | Discharge status: Home / Self Care | Payer: No Typology Code available for payment source | Attending: Emergency Medicine | Admitting: Emergency Medicine

## 2018-11-05 ENCOUNTER — Other Ambulatory Visit: Payer: Self-pay

## 2018-11-05 ENCOUNTER — Encounter: Payer: Self-pay | Admitting: Emergency Medicine

## 2018-11-05 DIAGNOSIS — G44321 Chronic post-traumatic headache, intractable: Secondary | ICD-10-CM | POA: Insufficient documentation

## 2018-11-05 DIAGNOSIS — R202 Paresthesia of skin: Secondary | ICD-10-CM | POA: Diagnosis not present

## 2018-11-05 DIAGNOSIS — G44311 Acute post-traumatic headache, intractable: Secondary | ICD-10-CM

## 2018-11-05 DIAGNOSIS — R51 Headache: Secondary | ICD-10-CM | POA: Diagnosis present

## 2018-11-05 DIAGNOSIS — Z87891 Personal history of nicotine dependence: Secondary | ICD-10-CM | POA: Diagnosis not present

## 2018-11-05 MED ORDER — KETOROLAC TROMETHAMINE 30 MG/ML IJ SOLN
30.0000 mg | Freq: Once | INTRAMUSCULAR | Status: AC
  Administered 2018-11-05: 30 mg via INTRAVENOUS
  Filled 2018-11-05: qty 1

## 2018-11-05 MED ORDER — PROCHLORPERAZINE EDISYLATE 10 MG/2ML IJ SOLN
10.0000 mg | Freq: Once | INTRAMUSCULAR | Status: AC
  Administered 2018-11-05: 10 mg via INTRAVENOUS
  Filled 2018-11-05: qty 2

## 2018-11-05 MED ORDER — DIPHENHYDRAMINE HCL 50 MG/ML IJ SOLN
25.0000 mg | Freq: Once | INTRAMUSCULAR | Status: AC
  Administered 2018-11-05: 25 mg via INTRAVENOUS
  Filled 2018-11-05: qty 1

## 2018-11-05 MED ORDER — KETOROLAC TROMETHAMINE 10 MG PO TABS: 10.0000 mg | ORAL_TABLET | Freq: Four times a day (QID) | ORAL | 0 refills | Status: DC | PRN

## 2018-11-05 MED ORDER — METOCLOPRAMIDE HCL 10 MG PO TABS: 10.0000 mg | ORAL_TABLET | Freq: Four times a day (QID) | ORAL | 0 refills | Status: DC | PRN

## 2018-11-05 MED ORDER — PREDNISONE 10 MG (21) PO TBPK: ORAL_TABLET | ORAL | 0 refills | Status: DC

## 2018-11-05 MED ORDER — DIPHENHYDRAMINE HCL 25 MG PO TABS: 25.0000 mg | ORAL_TABLET | Freq: Four times a day (QID) | ORAL | 0 refills | Status: DC | PRN

## 2018-11-05 MED ORDER — DEXAMETHASONE SODIUM PHOSPHATE 10 MG/ML IJ SOLN
10.0000 mg | Freq: Once | INTRAMUSCULAR | Status: AC
  Administered 2018-11-05: 10 mg via INTRAVENOUS
  Filled 2018-11-05: qty 1

## 2018-11-05 MED ORDER — SODIUM CHLORIDE 0.9 % IV BOLUS
1000.0000 mL | Freq: Once | INTRAVENOUS | Status: AC
  Administered 2018-11-05: 1000 mL via INTRAVENOUS

## 2018-11-05 NOTE — Discharge Instructions (Signed)
Please follow up with neurology for symptoms that are not improving over the week.  Return to the ER for symptoms that change or worsen if unable to schedule an appointment.
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
## 2019-04-02 ENCOUNTER — Other Ambulatory Visit: Payer: Self-pay

## 2019-04-02 ENCOUNTER — Ambulatory Visit: Payer: Self-pay | Admitting: Physician Assistant

## 2019-04-02 ENCOUNTER — Encounter: Payer: Self-pay | Admitting: Physician Assistant

## 2019-04-02 VITALS — BP 111/82 | Ht 70.0 in | Wt 343.0 lb

## 2019-04-02 DIAGNOSIS — Z3009 Encounter for other general counseling and advice on contraception: Secondary | ICD-10-CM

## 2019-04-02 DIAGNOSIS — A5901 Trichomonal vulvovaginitis: Secondary | ICD-10-CM

## 2019-04-02 DIAGNOSIS — Z3045 Encounter for surveillance of transdermal patch hormonal contraceptive device: Secondary | ICD-10-CM

## 2019-04-02 DIAGNOSIS — Z113 Encounter for screening for infections with a predominantly sexual mode of transmission: Secondary | ICD-10-CM

## 2019-04-02 LAB — WET PREP FOR TRICH, YEAST, CLUE
Trichomonas Exam: POSITIVE — AB
Yeast Exam: NEGATIVE

## 2019-04-02 LAB — PREGNANCY, URINE: Preg Test, Ur: NEGATIVE

## 2019-04-02 MED ORDER — METRONIDAZOLE 500 MG PO TABS: 2000.0000 mg | ORAL_TABLET | Freq: Once | ORAL | 0 refills | Status: AC

## 2019-04-02 MED ORDER — XULANE 150-35 MCG/24HR TD PTWK: 1.0000 | MEDICATED_PATCH | TRANSDERMAL | 3 refills | Status: DC

## 2019-04-02 NOTE — Progress Notes (Signed)
STI clinic/screening visit  Subjective:  Linda Sawyer is a 28 y.o. female being seen today for an STI screening visit. The patient reports they do not have symptoms.  Patient has the following medical conditions:   Patient Active Problem List   Diagnosis Date Noted  . Acetaminophen overdose 06/27/2015  . Obesity, unspecified 06/08/2014  . 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 SEXUALLY TRANSMITTED DISEASE    HPI  Patient reports that she would like a screening since she has a new partner.  Denies any concerns except that she ran out of her Rx for Xulane patch and would like refills sent to her pharmacy.  See flowsheet for further details and programmatic requirements.    The following portions of the patient's history were reviewed and updated as appropriate: allergies, current medications, past medical history, past social history, past surgical history and problem list.  Objective:   Vitals:   04/02/19 1647  BP: 111/82  Weight: (!) 343 lb (155.6 kg)    Physical Exam Vitals signs reviewed.  Constitutional:      Appearance: Normal appearance. She is obese.  HENT:     Head: Normocephalic and atraumatic.     Mouth/Throat:     Mouth: Mucous membranes are moist.     Pharynx: Oropharynx is clear. No oropharyngeal exudate or posterior oropharyngeal erythema.  Eyes:     Conjunctiva/sclera: Conjunctivae normal.  Neck:     Musculoskeletal: Neck supple.  Pulmonary:     Effort: Pulmonary effort is normal.  Abdominal:     Palpations: Abdomen is soft. There is no mass.     Tenderness: There is no abdominal tenderness. There is no rebound.  Genitourinary:    General: Normal vulva.     Rectum: Normal.     Comments: External genitalia/pubic area without nits, lice, edema, erythema, lesions and inguinal adenopathy. Vagina with normal mucosa and discharge, pH=4.5. Cervix without visible lesions. Uterus  firm, mobile, nt, no masses, no CMT, no adnexal tenderness. Lymphadenopathy:     Cervical: No cervical adenopathy.  Skin:    General: Skin is warm and dry.     Findings: No bruising, erythema, lesion or rash.  Neurological:     Mental Status: She is alert and oriented to person, place, and time.  Psychiatric:        Mood and Affect: Mood normal.        Behavior: Behavior normal.        Thought Content: Thought content normal.        Judgment: Judgment normal.       Assessment and Plan:  Linda Sawyer is a 28 y.o. female presenting to the [facility] Department for STI screening  1. Screening for STD (sexually transmitted disease) Patient is without symptoms today. Rec condoms with all sex Await test results.  Counseled that RN will call if needs to RTC for any further treatment once results are back. - Pregnancy, urine - WET PREP FOR TRICH, YEAST, CLUE - Chlamydia/Gonorrhea [place] [facility] - HIV [place] [facility] - Syphilis Serology, [place] [facility] - Gonococcus culture  2. Encounter for counseling regarding contraception Counseled patient that she will need to RTC for PE in the new year to continue with Rx for patch. Reviewed with patient to use condoms with all sex for 2 weeks and repeat PT at home in 2 weeks.  RTC if pregnancy test at  home is positive.   3. Encounter for surveillance of transdermal patch hormonal contraceptive device Rx will be sent electronically to [facility] in [place] per patient request for Xulane patch, 1 month supply apply 1 patch per week for 3 weeks, patch free for week 4 and repeat with refills x 3.   4. Trichomonal vaginitis Will treat for Trich on wet mount with Metronidazole 2 g po at one time with food, no EtOH for 24 hr before and until 72 hr after taking medicine. No sex for 7 days and until after partner completes treatment. RTC for re-treatment if vomits < 2 hr after taking medicine. - metroNIDAZOLE (FLAGYL) 500 MG tablet; Take 4 tablets (2,000  mg total) by mouth once for 1 dose.  Dispense: 4 tablet; Refill: 0     No follow-ups on file.  No future appointments.  Jerene Dilling, PA

## 2019-04-07 LAB — GONOCOCCUS CULTURE

## 2019-04-20 ENCOUNTER — Other Ambulatory Visit: Payer: Self-pay

## 2019-04-20 ENCOUNTER — Encounter: Payer: Self-pay | Admitting: Emergency Medicine

## 2019-04-20 DIAGNOSIS — R102 Pelvic and perineal pain: Secondary | ICD-10-CM | POA: Insufficient documentation

## 2019-04-20 DIAGNOSIS — Z5321 Procedure and treatment not carried out due to patient leaving prior to being seen by health care provider: Secondary | ICD-10-CM | POA: Insufficient documentation

## 2019-04-20 NOTE — ED Triage Notes (Signed)
Patient with vaginal pain and irritation times 4 days. Patient states that she has had a yeast infection in the past and that this feels different. Patient states that she has tried creams for yeast infection but that has not helped. Patient states that she does have some pain with urination. Patient states that she has recently ahd a new sexual partner but has used condoms.

## 2019-04-21 ENCOUNTER — Emergency Department
Admission: EM | Admit: 2019-04-21 | Discharge: 2019-04-21 | Disposition: A | Discharge status: Home / Self Care | Payer: Medicaid Other | Attending: Emergency Medicine | Admitting: Emergency Medicine

## 2019-07-03 ENCOUNTER — Encounter: Payer: Self-pay | Admitting: Physician Assistant

## 2019-07-03 ENCOUNTER — Other Ambulatory Visit: Payer: Self-pay

## 2019-07-03 ENCOUNTER — Ambulatory Visit: Payer: Self-pay | Admitting: Physician Assistant

## 2019-07-03 DIAGNOSIS — Z113 Encounter for screening for infections with a predominantly sexual mode of transmission: Secondary | ICD-10-CM

## 2019-07-03 LAB — PREGNANCY, URINE: Preg Test, Ur: NEGATIVE

## 2019-07-03 LAB — WET PREP FOR TRICH, YEAST, CLUE
Trichomonas Exam: NEGATIVE
Yeast Exam: NEGATIVE

## 2019-07-03 MED ORDER — THERA VITAL M PO TABS: 1.0000 | ORAL_TABLET | Freq: Every day | ORAL | 0 refills | Status: DC

## 2019-07-03 NOTE — Progress Notes (Signed)
Pt here for STD screening.Tannar Broker, RN 

## 2019-07-03 NOTE — Progress Notes (Signed)
Urine PT is negative today. Wet mount reviewed with provider and no treatment needed per Sadie Haber, PA verbal order as pt is not having any symptoms. Pt received MVI's per pt request. Provider orders completed.Lyman Speller, RN

## 2019-07-05 NOTE — Progress Notes (Signed)
[facility] Department STI clinic/screening visit  Subjective:  Linda Sawyer is a 29 y.o. female being seen today for an STI screening visit. The patient reports they do not have symptoms.  Patient reports that they may  desire a pregnancy in the next year.   They reported they are not interested in discussing contraception today.  Patient's last menstrual period was 05/12/2019 (within days).   Patient has the following medical conditions:   Patient Active Problem List   Diagnosis Date Noted  . Acetaminophen overdose 06/27/2015  . Obesity, unspecified 06/08/2014  . 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 SEXUALLY TRANSMITTED DISEASE    STD screening    HPI  Patient reports that she has "heard" that her partner may have cheated.  Denies any symptoms.  States that she has irregular periods, with her last period 05/11/2019, that was normal.    See flowsheet for further details and programmatic requirements.    The following portions of the patient's history were reviewed and updated as appropriate: allergies, current medications, past medical history, past social history, past surgical history and problem list.  Objective:  There were no vitals filed for this visit.  Physical Exam Constitutional:      General: She is not in acute distress.    Appearance: Normal appearance. She is obese.  HENT:     Head: Normocephalic and atraumatic.     Comments: No nits, lice or hair loss. No cervical, supraclavicular or axillary adenopathy.    Mouth/Throat:     Mouth: Mucous membranes are moist.     Pharynx: Oropharynx is clear. No oropharyngeal exudate or posterior oropharyngeal erythema.  Pulmonary:     Effort: Pulmonary effort is normal.  Abdominal:     Palpations: Abdomen is soft. There is no mass.     Tenderness: There is no abdominal tenderness. There is no guarding or rebound.  Genitourinary:  General: Normal vulva.     Rectum: Normal.     Comments: External genitalia/pubic area without nits, lice, edema, erythema, lesions and inguinal adenopathy. Vagina with normal mucosa and discharge. Cervix without visible lesions. Uterus firm, mobile, nt, no masses, no CMT, no adnexal tenderness or fullness. Musculoskeletal:     Cervical back: Neck supple. No tenderness.  Skin:    General: Skin is warm and dry.     Findings: No bruising, erythema, lesion or rash.  Neurological:     Mental Status: She is alert and oriented to person, place, and time.  Psychiatric:        Mood and Affect: Mood normal.        Behavior: Behavior normal.        Thought Content: Thought content normal.        Judgment: Judgment normal.      Assessment and Plan:  Linda Sawyer is a 29 y.o. female presenting to the [facility] Department for STI screening  1. Screening for STD (sexually transmitted disease) Patient into clinic without symptoms. Rec condoms with all sex. Await test results.  Counseled that RN will call if needs to RTC for treatment once results are back. - Pregnancy, urine - WET PREP FOR TRICH, YEAST, CLUE - Gonococcus culture - HIV/HCV [place] [facility] - Chlamydia/Gonorrhea [place] [facility] - Syphilis Serology, [place] [facility] - Multiple Vitamins-Minerals (MULTIVITAMIN) tablet; Take 1 tablet by mouth daily.  Dispense: 100 tablet; Refill: 0     Return  for PRN.  No future appointments.  Jerene Dilling, PA

## 2019-07-08 LAB — GONOCOCCUS CULTURE

## 2019-07-09 LAB — HM HEPATITIS C SCREENING LAB: HM Hepatitis Screen: NEGATIVE

## 2019-07-09 LAB — HM HIV SCREENING LAB: HM HIV Screening: NEGATIVE

## 2019-09-16 ENCOUNTER — Encounter: Payer: Self-pay | Admitting: Emergency Medicine

## 2019-09-16 ENCOUNTER — Emergency Department
Admission: EM | Admit: 2019-09-16 | Discharge: 2019-09-17 | Disposition: A | Discharge status: Home / Self Care | Payer: Medicaid Other | Attending: Student in an Organized Health Care Education/Training Program | Admitting: Student in an Organized Health Care Education/Training Program

## 2019-09-16 ENCOUNTER — Emergency Department: Payer: Medicaid Other

## 2019-09-16 DIAGNOSIS — H532 Diplopia: Secondary | ICD-10-CM | POA: Insufficient documentation

## 2019-09-16 DIAGNOSIS — R519 Headache, unspecified: Secondary | ICD-10-CM | POA: Insufficient documentation

## 2019-09-16 DIAGNOSIS — R030 Elevated blood-pressure reading, without diagnosis of hypertension: Secondary | ICD-10-CM | POA: Insufficient documentation

## 2019-09-16 DIAGNOSIS — R0789 Other chest pain: Secondary | ICD-10-CM | POA: Insufficient documentation

## 2019-09-16 LAB — BASIC METABOLIC PANEL
Anion gap: 7 (ref 5–15)
BUN: 8 mg/dL (ref 6–20)
CO2: 22 mmol/L (ref 22–32)
Calcium: 7.9 mg/dL — ABNORMAL LOW (ref 8.9–10.3)
Chloride: 107 mmol/L (ref 98–111)
Creatinine, Ser: 0.71 mg/dL (ref 0.44–1.00)
GFR calc Af Amer: >60 mL/min (ref >60)
GFR calc non Af Amer: >60 mL/min (ref >60)
Glucose, Bld: 87 mg/dL (ref 70–99)
Potassium: 3.7 mmol/L (ref 3.5–5.1)
Sodium: 136 mmol/L (ref 135–145)

## 2019-09-16 LAB — URINALYSIS, COMPLETE (UACMP) WITH MICROSCOPIC
Bacteria, UA: NONE SEEN
Bilirubin Urine: NEGATIVE
Glucose, UA: NEGATIVE mg/dL
Ketones, ur: NEGATIVE mg/dL
Leukocytes,Ua: NEGATIVE
Nitrite: NEGATIVE
Protein, ur: NEGATIVE mg/dL
Specific Gravity, Urine: 1.023 (ref 1.005–1.030)
pH: 7 (ref 5.0–8.0)

## 2019-09-16 LAB — TROPONIN I (HIGH SENSITIVITY)
Troponin I (High Sensitivity): <2 ng/L (ref <18)
Troponin I (High Sensitivity): 3 ng/L (ref <18)

## 2019-09-16 LAB — CBC
HCT: 35.1 % — ABNORMAL LOW (ref 36.0–46.0)
Hemoglobin: 10.6 g/dL — ABNORMAL LOW (ref 12.0–15.0)
MCH: 21.2 pg — ABNORMAL LOW (ref 26.0–34.0)
MCHC: 30.2 g/dL (ref 30.0–36.0)
MCV: 70.2 fL — ABNORMAL LOW (ref 80.0–100.0)
Platelets: 329 10*3/uL (ref 150–400)
RBC: 5 MIL/uL (ref 3.87–5.11)
RDW: 17.1 % — ABNORMAL HIGH (ref 11.5–15.5)
WBC: 5.4 10*3/uL (ref 4.0–10.5)
nRBC: 0 % (ref 0.0–0.2)

## 2019-09-16 LAB — PREGNANCY, URINE: Preg Test, Ur: NEGATIVE

## 2019-09-16 MED ORDER — ACETAMINOPHEN 500 MG PO TABS
1000.0000 mg | ORAL_TABLET | Freq: Once | ORAL | Status: AC
  Administered 2019-09-16: 1000 mg via ORAL
  Filled 2019-09-16: qty 2

## 2019-09-16 MED ORDER — DEXAMETHASONE SODIUM PHOSPHATE 10 MG/ML IJ SOLN
10.0000 mg | Freq: Once | INTRAMUSCULAR | Status: AC
  Administered 2019-09-16: 23:00:00 10 mg via INTRAVENOUS
  Filled 2019-09-16: qty 1

## 2019-09-16 MED ORDER — PROCHLORPERAZINE EDISYLATE 10 MG/2ML IJ SOLN
10.0000 mg | Freq: Once | INTRAMUSCULAR | Status: AC
  Administered 2019-09-16: 10 mg via INTRAVENOUS
  Filled 2019-09-16: qty 2

## 2019-09-16 MED ORDER — DIPHENHYDRAMINE HCL 50 MG/ML IJ SOLN
12.5000 mg | Freq: Once | INTRAMUSCULAR | Status: AC
  Administered 2019-09-16: 21:00:00 12.5 mg via INTRAVENOUS
  Filled 2019-09-16: qty 1

## 2019-09-16 MED ORDER — MAGNESIUM SULFATE 2 GM/50ML IV SOLN
2.0000 g | Freq: Once | INTRAVENOUS | Status: AC
  Administered 2019-09-16: 23:00:00 2 g via INTRAVENOUS
  Filled 2019-09-16: qty 50

## 2019-09-16 MED ORDER — SODIUM CHLORIDE 0.9 % IV BOLUS
500.0000 mL | Freq: Once | INTRAVENOUS | Status: AC
  Administered 2019-09-16: 500 mL via INTRAVENOUS

## 2019-09-16 NOTE — ED Triage Notes (Signed)
Pt reports that she was feeling dizzy at work and had staff check her BP. BP was elevated. Pt c/o headache and neck pain as well as dizziness. Pt has no prior hx/o hypertension. Pt denies chest pain and SOB.

## 2019-09-16 NOTE — ED Notes (Signed)
Green top redrawn and sent to lab. 

## 2019-09-16 NOTE — ED Provider Notes (Signed)
[facility] Emergency Department Provider Note    First MD Initiated Contact with Patient 09/16/19 2018     (approximate)  I have reviewed the triage vital signs and the nursing notes.   HISTORY  Chief Complaint Hypertension    HPI Linda Sawyer is a 29 y.o. female with the below listed past medical history presents to the ER for evaluation of feeling unwell at work this evening.  States that she felt like she had not had anything to eat.  Was feeling like she was having some chest discomfort as well as a headache.  Is not the worst headache of her life.  States that she did feel like she was seeing double.  No associated numbness or tingling.  No sudden onset headache.  Her coworkers check her blood pressure and it was elevated so she came to the ER.  Does still complain of some mild discomfort.  Denies any neck stiffness.  No recent fevers.    History reviewed. No pertinent past medical history. Family History  Problem Relation Age of Onset  . Diabetes Mother   . Hypertension Mother   . Asthma Brother   . Depression Brother   . Hodgkin's lymphoma Maternal Aunt   . Hypertension Sister   . Diabetes Maternal Grandmother   . Lung cancer Paternal Grandmother   . Pancreatic cancer Paternal Grandfather    Past Surgical History:  Procedure Laterality Date  . HAND SURGERY     Patient Active Problem List   Diagnosis Date Noted  . Acetaminophen overdose 06/27/2015  . Obesity, unspecified 06/08/2014  . 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 Authorizing Provider  diphenhydrAMINE (BENADRYL ALLERGY) 25 MG tablet Take 1 tablet (25 mg total) by mouth every 6 (six) hours as needed. Patient not taking: Reported on 04/02/2019 11/05/18   Kem Boroughs B, FNP  fluticasone (FLONASE) 50 MCG/ACT nasal spray Place 2 sprays into both nostrils daily. 04/13/18  04/13/19  Enid Derry, PA-C  HYDROcodone-acetaminophen (NORCO/VICODIN) 5-325 MG tablet Take 1-2 tablets by mouth every 6 (six) hours as needed for moderate pain or severe pain. Patient not taking: Reported on 04/02/2019 11/01/18   Shaune Pollack, MD  ibuprofen (ADVIL) 600 MG tablet Take 1 tablet (600 mg total) by mouth every 8 (eight) hours as needed for moderate pain. Patient not taking: Reported on 04/02/2019 11/01/18   Shaune Pollack, MD  ketorolac (TORADOL) 10 MG tablet Take 1 tablet (10 mg total) by mouth every 6 (six) hours as needed. Patient not taking: Reported on 04/02/2019 11/05/18   Kem Boroughs B, FNP  metoCLOPramide (REGLAN) 10 MG tablet Take 1 tablet (10 mg total) by mouth every 6 (six) hours as needed for nausea. Patient not taking: Reported on 04/02/2019 11/05/18   Kem Boroughs B, FNP  Multiple Vitamins-Minerals (MULTIVITAMIN) tablet Take 1 tablet by mouth daily. 07/03/19   Federico Flake, MD  norelgestromin-ethinyl estradiol [employer] [facility]) 150-35 MCG/24HR transdermal patch Place 1 patch onto the skin once a week. 04/02/19   Matt Holmes, PA  predniSONE (STERAPRED UNI-PAK 21 TAB) 10 MG (21) TBPK tablet Take 6 tablets on the first day and decrease by 1 tablet each day until finished. Patient not taking: Reported on 04/02/2019 11/05/18   Chinita Pester, FNP    Allergies Valium [diazepam] and Chocolate    Social History Social History   Tobacco Use  .  Smoking status: Former Smoker    Quit date: 03/01/2015    Years since quitting: 4.5  . Smokeless tobacco: Never Used  Substance Use Topics  . Alcohol use: Not Currently  . Drug use: No    Review of Systems Patient denies headaches, rhinorrhea, blurry vision, numbness, shortness of breath, chest pain, edema, cough, abdominal pain, nausea, vomiting, diarrhea, dysuria, fevers, rashes or hallucinations unless otherwise stated above in HPI. ____________________________________________   PHYSICAL EXAM:  VITAL  SIGNS: Vitals:   09/16/19 2115 09/16/19 2130  BP: 130/72 138/90  Pulse:    Resp:    Temp:    SpO2:      Constitutional: Alert and oriented.  Eyes: Conjunctivae are normal.  Head: Atraumatic. Nose: No congestion/rhinnorhea. Mouth/Throat: Mucous membranes are moist.   Neck: No stridor. Painless ROM.  Cardiovascular: Normal rate, regular rhythm. Grossly normal heart sounds.  Good peripheral circulation. Respiratory: Normal respiratory effort.  No retractions. Lungs CTAB. Gastrointestinal: Soft and nontender. No distention. No abdominal bruits. No CVA tenderness. Genitourinary:  Musculoskeletal: No lower extremity tenderness nor edema.  No joint effusions. Neurologic:  CN- intact.  No facial droop, Normal FNF.  Normal heel to shin.  Sensation intact bilaterally. Normal speech and language. No gross focal neurologic deficits are appreciated. No gait instability.  Skin:  Skin is warm, dry and intact. No rash noted. Psychiatric: Mood and affect are normal. Speech and behavior are normal.  ____________________________________________   LABS (all labs ordered are listed, but only abnormal results are displayed)  Results for orders placed or performed during the hospital encounter of 09/16/19 (from the past 24 hour(s))  CBC     Status: Abnormal   Collection Time: 09/16/19  8:39 PM  Result Value Ref Range   WBC 5.4 4.0 - 10.5 K/uL   RBC 5.00 3.87 - 5.11 MIL/uL   Hemoglobin 10.6 (L) 12.0 - 15.0 g/dL   HCT 47.8 (L) 29.5 - 62.1 %   MCV 70.2 (L) 80.0 - 100.0 fL   MCH 21.2 (L) 26.0 - 34.0 pg   MCHC 30.2 30.0 - 36.0 g/dL   RDW 30.8 (H) 65.7 - 84.6 %   Platelets 329 150 - 400 K/uL   nRBC 0.0 0.0 - 0.2 %  Urinalysis, Complete w Microscopic     Status: Abnormal   Collection Time: 09/16/19  8:39 PM  Result Value Ref Range   Color, Urine YELLOW (A) YELLOW   APPearance CLEAR (A) CLEAR   Specific Gravity, Urine 1.023 1.005 - 1.030   pH 7.0 5.0 - 8.0   Glucose, UA NEGATIVE NEGATIVE mg/dL     Hgb urine dipstick LARGE (A) NEGATIVE   Bilirubin Urine NEGATIVE NEGATIVE   Ketones, ur NEGATIVE NEGATIVE mg/dL   Protein, ur NEGATIVE NEGATIVE mg/dL   Nitrite NEGATIVE NEGATIVE   Leukocytes,Ua NEGATIVE NEGATIVE   RBC / HPF 0-5 0 - 5 RBC/hpf   WBC, UA 0-5 0 - 5 WBC/hpf   Bacteria, UA NONE SEEN NONE SEEN   Squamous Epithelial / LPF 0-5 0 - 5  Pregnancy, urine     Status: None   Collection Time: 09/16/19  8:39 PM  Result Value Ref Range   Preg Test, Ur NEGATIVE NEGATIVE  Basic metabolic panel     Status: Abnormal   Collection Time: 09/16/19  9:29 PM  Result Value Ref Range   Sodium 136 135 - 145 mmol/L   Potassium 3.7 3.5 - 5.1 mmol/L   Chloride 107 98 - 111 mmol/L   CO2  22 22 - 32 mmol/L   Glucose, Bld 87 70 - 99 mg/dL   BUN 8 6 - 20 mg/dL   Creatinine, Ser 0.71 0.44 - 1.00 mg/dL   Calcium 7.9 (L) 8.9 - 10.3 mg/dL   GFR calc non Af Amer >60 >60 mL/min   GFR calc Af Amer >60 >60 mL/min   Anion gap 7 5 - 15  Troponin I (High Sensitivity)     Status: None   Collection Time: 09/16/19  9:29 PM  Result Value Ref Range   Troponin I (High Sensitivity) <2 <18 ng/L  Troponin I (High Sensitivity)     Status: None   Collection Time: 09/16/19 10:35 PM  Result Value Ref Range   Troponin I (High Sensitivity) 3 <18 ng/L   ____________________________________________  EKG My review and personal interpretation at Time: 20:12   Indication: htn  Rate: 70  Rhythm: sinus Axis: normal Other: normal iontervals, no stemi, no depressions ____________________________________________  RADIOLOGY  Mix one tablespoon with 8oz of your favorite juice or water every day until you are having soft formed stools. Then start taking once daily if you didn't have a stool the day before.  ____________________________________________   PROCEDURES  Procedure(s) performed:  Procedures    Critical Care performed: no ____________________________________________   INITIAL IMPRESSION / ASSESSMENT  AND PLAN / ED COURSE  Pertinent labs & imaging results that were available during my care of the patient were reviewed by me and considered in my medical decision making (see chart for details).   DDX: Hypertension, migraine, tension, cluster  Linda Sawyer is a 29 y.o. who presents to the ED with symptoms as described above.  Patient exceedingly well-appearing clinically.  Exam is reassuring.  No focal neuro deficits.  Do not feel that neuroimaging clinically indicated.  Not consistent with subarachnoid no evidence of meningeal irritation.  Not consistent with meningitis.  Will check basic blood work.  Patient primarily concerned about elevated blood pressure but suspect this might be stress or discomfort driven.  Will give migraine cocktail and reassess.  Clinical Course as of Sep 15 2341  Tue Sep 16, 2019  2225 Patient feels somewhat improved but still having some headache.  Repeat exam is nonfocal.  She is exceedingly well-appearing clinically.  Have a low suspicion for infection or subarachnoid.  Not consistent with mass occupying lesion.  Will give additional analgesia via IV medication and reassess.   [PR]    Clinical Course User Index [PR] Merlyn Lot, MD   Patient reassessed.  Pain resolved.  Repeat neuro exam remained stable.  Patient cleared for outpatient follow-up.   The patient was evaluated in Emergency Department today for the symptoms described in the history of present illness. He/she was evaluated in the context of the global COVID-19 pandemic, which necessitated consideration that the patient might be at risk for infection with the SARS-CoV-2 virus that causes COVID-19. Institutional protocols and algorithms that pertain to the evaluation of patients at risk for COVID-19 are in a state of rapid change based on information released by regulatory bodies including the CDC and federal and state organizations. These policies and algorithms were followed during the patient's  care in the ED.  As part of my medical decision making, I reviewed the following data within the [place] notes reviewed and incorporated, Labs reviewed, notes from prior ED visits and [place] Controlled Substance Database   ____________________________________________   FINAL CLINICAL IMPRESSION(S) / ED DIAGNOSES  Final diagnoses:  Acute nonintractable headache, unspecified headache type      NEW MEDICATIONS STARTED DURING THIS VISIT:  New Prescriptions   No medications on file     Note:  This document was prepared using Dragon voice recognition software and may include unintentional dictation errors.    Willy Eddy, MD 09/16/19 [phone]

## 2019-09-16 NOTE — Discharge Instructions (Signed)

## 2019-09-16 NOTE — ED Notes (Signed)
Pt to the er for headache, dizziness and double vision. Pt states she had episodes of HTN and currently is seeing double in vision intermittently based on position. Grips equal. Negative stroke signs. Face is symmetrical. ED provider at bedside.

## 2019-09-16 NOTE — ED Notes (Signed)
Pt reports not liking the way the medicine makes her feel. Pt describes as a "monkey on her back". Pt asking about timeframe for d/c. Explained plan of care.

## 2019-10-30 ENCOUNTER — Ambulatory Visit: Payer: Medicaid Other

## 2019-10-31 ENCOUNTER — Other Ambulatory Visit: Payer: Self-pay

## 2019-10-31 ENCOUNTER — Encounter: Payer: Self-pay | Admitting: Physician Assistant

## 2019-10-31 ENCOUNTER — Ambulatory Visit: Payer: Self-pay | Admitting: Physician Assistant

## 2019-10-31 DIAGNOSIS — Z113 Encounter for screening for infections with a predominantly sexual mode of transmission: Secondary | ICD-10-CM

## 2019-10-31 DIAGNOSIS — Z3202 Encounter for pregnancy test, result negative: Secondary | ICD-10-CM

## 2019-10-31 LAB — WET PREP FOR TRICH, YEAST, CLUE
Trichomonas Exam: NEGATIVE
Yeast Exam: NEGATIVE

## 2019-10-31 LAB — PREGNANCY, URINE: Preg Test, Ur: NEGATIVE

## 2019-10-31 MED ORDER — MULTIVITAMINS PO CAPS: 1.0000 | ORAL_CAPSULE | Freq: Every day | ORAL | 0 refills | Status: AC

## 2019-10-31 NOTE — Progress Notes (Signed)
Here today for STD screening. Accepts bloodwork. Juniper Snyders, RN ° °

## 2019-10-31 NOTE — Progress Notes (Signed)
[employer] results reviewed. Per standing orders no treatment indicated. PT negative. Tawny Hopping, RN

## 2019-11-01 NOTE — Progress Notes (Signed)
[facility] Department STI clinic/screening visit  Subjective:  Linda Sawyer is a 29 y.o. female being seen today for an STI screening visit. The patient reports they do have symptoms.  Patient reports that they would be happy if a  pregnancy were to occur in the next year.   They reported they are not interested in discussing contraception today.  Patient's last menstrual period was 10/16/2019 (exact date).   Patient has the following medical conditions:   Patient Active Problem List   Diagnosis Date Noted  . Acetaminophen overdose 06/27/2015  . Obesity, unspecified 06/08/2014  . Depressive disorder, major recur, unspecified ([place]) 07/27/2006    Chief Complaint  Patient presents with  . SEXUALLY TRANSMITTED DISEASE    HPI  Patient reports that she has noticed a slightly darker discharge for about 1 week.  Has a history of depression but is not currently having any symptoms.  States that her last period on 10/16/2019 was only for 3 days and usually is for 6 days.     See flowsheet for further details and programmatic requirements.    The following portions of the patient's history were reviewed and updated as appropriate: allergies, current medications, past medical history, past social history, past surgical history and problem list.  Objective:  There were no vitals filed for this visit.  Physical Exam Constitutional:      Appearance: Normal appearance.  HENT:     Head: Normocephalic and atraumatic.     Comments: No nits, lice, or hair loss. No cervical, supraclavicular or axillary adenopathy.    Mouth/Throat:     Mouth: Mucous membranes are moist.     Pharynx: Oropharynx is clear. No oropharyngeal exudate or posterior oropharyngeal erythema.  Eyes:     Conjunctiva/sclera: Conjunctivae normal.  Pulmonary:     Effort: Pulmonary effort is normal.  Abdominal:     Palpations: Abdomen is soft. There is no mass.     Tenderness: There is no abdominal tenderness.  There is no guarding or rebound.  Genitourinary:    General: Normal vulva.     Rectum: Normal.     Comments: External genitalia/pubic area without nits, lice, edema, erythema, lesions and inguinal adenopatny. Vagina with normal mucosa and discharge. Cervix without visible lesions. Uterus firm, mobile, nt., no masses, no CMT, no adnexal tenderness or fullness. Musculoskeletal:     Cervical back: Neck supple. No tenderness.  Skin:    General: Skin is warm and dry.     Findings: No bruising, erythema, lesion or rash.  Neurological:     Mental Status: She is alert and oriented to person, place, and time.  Psychiatric:        Mood and Affect: Mood normal.        Behavior: Behavior normal.        Thought Content: Thought content normal.        Judgment: Judgment normal.      Assessment and Plan:  Linda Sawyer is a 29 y.o. female presenting to the [facility] Department for STI screening  1. Screening for STD (sexually transmitted disease) Patient into clinic with symptoms.  Rec condoms with all sex. Await test results.  Counseled that RN will call if needs to RTC for treatment once results are back.  - WET PREP FOR TRICH, YEAST, CLUE - Pregnancy, urine - Gonococcus culture - Chlamydia/Gonorrhea [place] [facility] - HIV [place] [facility] - Syphilis Serology, [place] [facility]   2. Pregnancy examination or  test, negative result Patient with negative pregnancy test today but would like to get pregnant. Rec MVI 1 po daily for folic acid. Reviewed healthy habits for patient and partner for healthy pregnancy. - Multiple Vitamin (MULTIVITAMIN) capsule; Take 1 capsule by mouth daily for 100 doses.  Dispense: 100 capsule; Refill: 0   No follow-ups on file.  No future appointments.  Matt Holmes, PA

## 2019-11-04 LAB — GONOCOCCUS CULTURE

## 2019-12-16 ENCOUNTER — Telehealth: Payer: Self-pay | Admitting: General Practice

## 2019-12-16 NOTE — Telephone Encounter (Signed)
Individual has been contacted 3+ times regarding ED referral and has been given information regarding how to become a pt. No further attempts to contact individual will be made. 

## 2020-02-12 ENCOUNTER — Ambulatory Visit: Payer: Medicaid Other

## 2020-02-12 ENCOUNTER — Encounter: Payer: Self-pay | Admitting: Advanced Practice Midwife

## 2020-02-12 ENCOUNTER — Ambulatory Visit: Payer: Self-pay | Admitting: Advanced Practice Midwife

## 2020-02-12 ENCOUNTER — Other Ambulatory Visit: Payer: Self-pay

## 2020-02-12 ENCOUNTER — Ambulatory Visit: Payer: Self-pay

## 2020-02-12 ENCOUNTER — Ambulatory Visit (LOCAL_COMMUNITY_HEALTH_CENTER): Payer: Medicaid Other | Admitting: Advanced Practice Midwife

## 2020-02-12 VITALS — BP 113/77 | Ht 70.0 in | Wt 343.0 lb

## 2020-02-12 DIAGNOSIS — Z113 Encounter for screening for infections with a predominantly sexual mode of transmission: Secondary | ICD-10-CM

## 2020-02-12 DIAGNOSIS — F17200 Nicotine dependence, unspecified, uncomplicated: Secondary | ICD-10-CM

## 2020-02-12 DIAGNOSIS — Z3009 Encounter for other general counseling and advice on contraception: Secondary | ICD-10-CM | POA: Diagnosis not present

## 2020-02-12 DIAGNOSIS — F1210 Cannabis abuse, uncomplicated: Secondary | ICD-10-CM | POA: Insufficient documentation

## 2020-02-12 DIAGNOSIS — Z30016 Encounter for initial prescription of transdermal patch hormonal contraceptive device: Secondary | ICD-10-CM | POA: Diagnosis not present

## 2020-02-12 MED ORDER — XULANE 150-35 MCG/24HR TD PTWK: 1.0000 | MEDICATED_PATCH | TRANSDERMAL | 0 refills | Status: DC

## 2020-02-12 NOTE — Progress Notes (Signed)
[facility] Department STI clinic/screening visit  Subjective:  Linda Sawyer is a 29 y.o. SBF nullip vaper female being seen today for an STI screening visit. The patient reports they do have symptoms.  Patient reports that they do desire a pregnancy in the next year.   They reported they are interested in discussing contraception today.  Patient's last menstrual period was 01/01/2020 (exact date).   Patient has the following medical conditions:   Patient Active Problem List   Diagnosis Date Noted  . Acetaminophen overdose 06/27/2015  . Obesity, unspecified 06/08/2014  . 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 SEXUALLY TRANSMITTED DISEASE    Screening    HPI  Patient reports LMP 01/01/20.  Last sex mid August without condom.    Last HIV test per patient/review of record was 10/31/19 Patient reports last pap was 06/2017 neg  See flowsheet for further details and programmatic requirements.    The following portions of the patient's history were reviewed and updated as appropriate: allergies, current medications, past medical history, past social history, past surgical history and problem list.  Objective:  There were no vitals filed for this visit.  Physical Exam Vitals and nursing note reviewed.  Constitutional:      Appearance: Normal appearance. She is obese.  HENT:     Head: Normocephalic and atraumatic.     Mouth/Throat:     Mouth: Mucous membranes are moist.     Pharynx: Oropharynx is clear. No oropharyngeal exudate or posterior oropharyngeal erythema.  Eyes:     Conjunctiva/sclera: Conjunctivae normal.  Pulmonary:     Effort: Pulmonary effort is normal.  Abdominal:     Palpations: Abdomen is soft. There is no mass.     Tenderness: There is no abdominal tenderness. There is no rebound.     Comments: Poor tone, soft without tenderness, increased adipose  Genitourinary:    General:  Normal vulva.     Exam position: Lithotomy position.     Pubic Area: No rash or pubic lice.      Labia:        Right: No rash or lesion.        Left: No rash or lesion.      Vagina: Normal. No vaginal discharge (scant white leukorrhea, ph<4.5), erythema, bleeding or lesions.     Cervix: Normal.     Uterus: Normal.      Adnexa: Right adnexa normal and left adnexa normal.     Rectum: Normal.     Comments: Difficult to assess uterus and ovaries due to increased adipose Lymphadenopathy:     Head:     Right side of head: No preauricular or posterior auricular adenopathy.     Left side of head: No preauricular or posterior auricular adenopathy.     Cervical: No cervical adenopathy.     Upper Body:     Right upper body: No supraclavicular or axillary adenopathy.     Left upper body: No supraclavicular or axillary adenopathy.     Lower Body: No right inguinal adenopathy. No left inguinal adenopathy.  Skin:    General: Skin is warm and dry.     Findings: No rash.  Neurological:     Mental Status: She is alert and oriented to person, place, and time.      Assessment and Plan:  Linda Sawyer is a 29 y.o. female presenting to the [facility] Department for  STI screening  1. Screening examination for venereal disease Treat wet mount per standing orders Immunization nurse consult Rx given for xulane #3 apply patch x 1 week x3 with week #4 patch free Needs PE if wants more birth control - Pregnancy, urine - WET PREP FOR TRICH, YEAST, CLUE - Syphilis Serology, [place] [facility] - Chlamydia/Gonorrhea [address] - HIV/HCV [place] [facility] - Gonococcus culture     No follow-ups on file.  No future appointments.  Alberteen Spindle, CNM

## 2020-02-12 NOTE — Progress Notes (Signed)
[employer] results reviewed. Per standing orders no treatment indicated. Patient also given birth control at this appt. See FP chart for documentation. Tawny Hopping, RN

## 2020-02-12 NOTE — Progress Notes (Signed)
Patient received birth control via STD clinic this am. See provider documentation in STD chart. Tawny Hopping, RN

## 2020-02-12 NOTE — Progress Notes (Signed)
Here today for STD screening. Accepts bloodwork. Kinnedy Mongiello, RN ° °

## 2020-02-17 LAB — GONOCOCCUS CULTURE

## 2020-02-19 ENCOUNTER — Encounter: Payer: Self-pay | Admitting: Family Medicine

## 2020-02-19 LAB — HM HIV SCREENING LAB: HM HIV Screening: NEGATIVE

## 2020-02-19 LAB — HM HEPATITIS C SCREENING LAB: HM Hepatitis Screen: NEGATIVE

## 2020-02-25 LAB — WET PREP FOR TRICH, YEAST, CLUE
Trichomonas Exam: NEGATIVE
Yeast Exam: NEGATIVE

## 2020-02-25 LAB — PREGNANCY, URINE: Preg Test, Ur: NEGATIVE

## 2020-05-18 ENCOUNTER — Encounter: Payer: Self-pay | Admitting: Physician Assistant

## 2020-05-18 ENCOUNTER — Ambulatory Visit: Payer: Self-pay | Admitting: Physician Assistant

## 2020-05-18 ENCOUNTER — Other Ambulatory Visit: Payer: Self-pay

## 2020-05-18 DIAGNOSIS — Z3045 Encounter for surveillance of transdermal patch hormonal contraceptive device: Secondary | ICD-10-CM

## 2020-05-18 DIAGNOSIS — Z3009 Encounter for other general counseling and advice on contraception: Secondary | ICD-10-CM

## 2020-05-18 DIAGNOSIS — Z113 Encounter for screening for infections with a predominantly sexual mode of transmission: Secondary | ICD-10-CM

## 2020-05-18 LAB — WET PREP FOR TRICH, YEAST, CLUE
Trichomonas Exam: NEGATIVE
Yeast Exam: NEGATIVE

## 2020-05-18 MED ORDER — XULANE 150-35 MCG/24HR TD PTWK: 1.0000 | MEDICATED_PATCH | TRANSDERMAL | 1 refills | Status: DC

## 2020-05-18 NOTE — Progress Notes (Addendum)
Patient here for STD testing. States she would like to get prescription for the patch, and will schedule appointment for PE and BCM. Last Pap 07/10/2017, negative.Marland KitchenMarland KitchenMarland KitchenBurt Knack, RN

## 2020-05-18 NOTE — Progress Notes (Signed)
Wet mount reviewed, no treatment indicated..Devynn Hessler Brewer-Jensen, RN 

## 2020-05-19 ENCOUNTER — Encounter: Payer: Self-pay | Admitting: Physician Assistant

## 2020-05-19 NOTE — Progress Notes (Signed)
[facility] Department STI clinic/screening visit  Subjective:  Linda Sawyer is a 29 y.o. female being seen today for an STI screening visit. The patient reports they do have symptoms.  Patient reports that they do not desire a pregnancy in the next year.   They reported they are interested in discussing contraception today.  Patient's last menstrual period was 04/05/2020.   Patient has the following medical conditions:   Patient Active Problem List   Diagnosis Date Noted  . 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 Marijuana abuse daily 02/12/2020  . Acetaminophen overdose 06/27/2015  . Obesity, unspecified 343 lbs 06/08/2014  . 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 SEXUALLY TRANSMITTED DISEASE    screening    HPI  Patient reports that she has had "boils" in her genital area off and on for 1 month.  Denies other symptoms but would like a screening today.  Denies chronic conditions and reports that she ran out of her patches about 6 weeks ago.  Reports that she bled for one month, from 10/25/221 until 05/07/2020.   Patient requests Rx for patch to be sent to her pharmacy so that she can restart the patch before she comes back in for her RP. States that she called to make her appointment for the RP but thinks that due to problems with her phone reception the person that made her appointment was not able to hear her clearly. States last HIV test was in 02/2020 and last pap was.  See flowsheet for further details and programmatic requirements.    The following portions of the patient's history were reviewed and updated as appropriate: allergies, current medications, past medical history, past social history, past surgical history and problem list.  Objective:  There were no vitals filed for this visit.  Physical Exam Constitutional:      General: She is not in acute distress.     Appearance: Normal appearance.  HENT:     Head: Normocephalic and atraumatic.     Comments: No nits,lice, or hair loss. No cervical, supraclavicular or axillary adenopathy.    Mouth/Throat:     Mouth: Mucous membranes are moist.     Pharynx: Oropharynx is clear. No oropharyngeal exudate or posterior oropharyngeal erythema.  Eyes:     Conjunctiva/sclera: Conjunctivae normal.  Pulmonary:     Effort: Pulmonary effort is normal.  Abdominal:     Palpations: Abdomen is soft. There is no mass.     Tenderness: There is no abdominal tenderness. There is no guarding or rebound.  Genitourinary:    General: Normal vulva.     Rectum: Normal.     Comments: External genitalia/pubic area without nits, lice, edema, erythema, lesions and inguinal adenopathy. Vagina with normal mucosa and discharge. Cervix without visible lesions. Uterus firm, mobile, nt, no masses, no CMT, no adnexal tenderness or fullness. Musculoskeletal:     Cervical back: Neck supple. No tenderness.  Skin:    General: Skin is warm and dry.     Findings: No bruising, erythema, lesion or rash.  Neurological:     Mental Status: She is alert and oriented to person, place, and time.  Psychiatric:        Mood and Affect: Mood normal.        Behavior: Behavior normal.        Thought Content: Thought content normal.        Judgment: Judgment normal.  Assessment and Plan:  [place] JAYDALEE BARDWELL is a 29 y.o. female presenting to the [facility] Department for STI screening  1. Screening for STD (sexually transmitted disease) Patient into clinic without symptoms. No current lesions/boils noted on exam today.  Reviewed with patient steps to prevent ingrown hairs. Rec condoms with all sex. Await test results.  Counseled that RN will call if needs to RTC for treatment once results are back. - WET PREP FOR TRICH, YEAST, CLUE - Chlamydia/Gonorrhea [place] [facility] - HIV [place] [facility] - Syphilis Serology, [place] [facility]  2.  Encounter for counseling regarding contraception Counseled patient that I can send her a prescription but that she will need to have her annual visit prior to further refills. - norelgestromin-ethinyl estradiol [employer]) 150-35 MCG/24HR transdermal patch; Place 1 patch onto the skin once a week.  Dispense: 3 patch; Refill: 1  3. Encounter for surveillance of transdermal patch hormonal contraceptive device Rx sent to patient's pharmacy of choice with 1 refill.     No follow-ups on file.  No future appointments.  Matt Holmes, PA

## 2020-10-13 ENCOUNTER — Ambulatory Visit: Payer: Medicaid Other

## 2020-10-21 ENCOUNTER — Other Ambulatory Visit: Payer: Self-pay

## 2020-10-21 ENCOUNTER — Encounter: Payer: Self-pay | Admitting: Family Medicine

## 2020-10-21 ENCOUNTER — Ambulatory Visit: Payer: Self-pay | Admitting: Family Medicine

## 2020-10-21 DIAGNOSIS — Z113 Encounter for screening for infections with a predominantly sexual mode of transmission: Secondary | ICD-10-CM

## 2020-10-21 LAB — HM HEPATITIS C SCREENING LAB: HM Hepatitis Screen: NEGATIVE

## 2020-10-21 LAB — HM HIV SCREENING LAB: HM HIV Screening: NEGATIVE

## 2020-10-21 LAB — WET PREP FOR TRICH, YEAST, CLUE
Trichomonas Exam: NEGATIVE
Yeast Exam: NEGATIVE

## 2020-10-21 LAB — HEPATITIS B SURFACE ANTIGEN

## 2020-10-21 NOTE — Progress Notes (Signed)
Pt here for STD screening.  Wet mount results reviewed with Provider, no treatment needed.  Pt declined condoms. Berdie Ogren, RN

## 2020-10-22 NOTE — Progress Notes (Signed)
[facility] Department STI clinic/screening visit  Subjective:  Linda Sawyer is a 30 y.o. female being seen today for an STI screening visit. The patient reports they do have symptoms.  Patient reports that they do not desire a pregnancy in the next year.   They reported they are not interested in discussing contraception today.  Patient's last menstrual period was 09/06/2020 (approximate).   Patient has the following medical conditions:   Patient Active Problem List   Diagnosis Date Noted  . 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 Marijuana abuse daily 02/12/2020  . Acetaminophen overdose 06/27/2015  . Obesity, unspecified 343 lbs 06/08/2014  . 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 SEXUALLY TRANSMITTED DISEASE    screening    HPI  Patient reports here for screening.  Reports having symptoms x 1 week  Last HIV test per patient/review of record was 05/2020 Patient reports last pap was " a couple of years ago" .   See flowsheet for further details and programmatic requirements.    The following portions of the patient's history were reviewed and updated as appropriate: allergies, current medications, past medical history, past social history, past surgical history and problem list.  Objective:  There were no vitals filed for this visit.  Physical Exam Vitals and nursing note reviewed.  Constitutional:      Appearance: Normal appearance.  HENT:     Head: Normocephalic and atraumatic.     Mouth/Throat:     Mouth: Mucous membranes are moist.     Pharynx: Oropharynx is clear. No oropharyngeal exudate or posterior oropharyngeal erythema.  Pulmonary:     Effort: Pulmonary effort is normal.  Chest:  Breasts:     Right: No axillary adenopathy or supraclavicular adenopathy.     Left: No axillary adenopathy or supraclavicular adenopathy.    Abdominal:     General:  Abdomen is flat.     Palpations: There is no mass.     Tenderness: There is no abdominal tenderness. There is no rebound.  Genitourinary:    General: Normal vulva.     Exam position: Lithotomy position.     Pubic Area: No rash or pubic lice.      Labia:        Right: No rash or lesion.        Left: No rash or lesion.      Vagina: Normal. No vaginal discharge, erythema, bleeding or lesions.     Cervix: No cervical motion tenderness, discharge, friability, lesion or erythema.     Uterus: Normal.      Adnexa: Right adnexa normal and left adnexa normal.     Rectum: Normal.  Lymphadenopathy:     Head:     Right side of head: No preauricular or posterior auricular adenopathy.     Left side of head: No preauricular or posterior auricular adenopathy.     Cervical: No cervical adenopathy.     Upper Body:     Right upper body: No supraclavicular or axillary adenopathy.     Left upper body: No supraclavicular or axillary adenopathy.     Lower Body: No right inguinal adenopathy. No left inguinal adenopathy.  Skin:    General: Skin is warm and dry.     Findings: No rash.  Neurological:     Mental Status: She is alert and oriented to person, place, and time.  Psychiatric:  Behavior: Behavior normal.      Assessment and Plan:  Linda Sawyer is a 30 y.o. female presenting to the [facility] Department for STI screening  1. Screening examination for venereal disease - Chlamydia/Gonorrhea [place] [facility] - HBV Antigen/Antibody [facility] - HIV/HCV [place] [facility] - Syphilis Serology, [place] [facility] - WET PREP FOR TRICH, YEAST, CLUE - Chlamydia/Gonorrhea [place] [facility] Patient accepted all screenings including wet prep, oral, vaginal CT/GC and bloodwork for HIV/RPR.  Patient meets criteria for HepB screening? Yes. Ordered? Yes Patient meets criteria for HepC screening? Yes. Ordered? Yes  Wet prep results neg  No treatment needed  Discussed time line for [facility] results and that  patient will be called with positive results and encouraged patient to call if she had not heard in 2 weeks.  Counseled to return or seek care for continued or worsening symptoms Recommended condom use with all sex  Patient is currently using no BCM to prevent pregnancy.      Return in about 2 weeks (around 11/04/2020) for annual well woman exam and PAP .  Future Appointments  Date Time Provider Department Center  11/04/2020 10:40 AM [facility] PROVIDER [facility] None    Wendi Snipes, FNP

## 2020-11-03 ENCOUNTER — Telehealth: Payer: Self-pay

## 2020-11-03 NOTE — Telephone Encounter (Signed)
Re-attempted contact with client via phone call to reschedule 11/04/2020 am appt. Left message regarding above and number to call provided. Call to mother (emegency contact) who states she will get message to client to reschedule appt. Jossie Ng, RN

## 2020-11-03 NOTE — Telephone Encounter (Signed)
Call to client to reschedule 11/04/2020 am appt due to provider availability. Left message to call and number provided. Jossie Ng, RN

## 2020-11-04 ENCOUNTER — Telehealth: Payer: Self-pay

## 2020-11-04 ENCOUNTER — Ambulatory Visit: Payer: Medicaid Other

## 2020-11-04 NOTE — Telephone Encounter (Signed)
Call to client and continues to go directly to voicemail. Left message to call and reschedule 11/04/2020 appt due to provider availability and number to call provided. Jossie Ng, RN

## 2020-11-04 NOTE — Telephone Encounter (Signed)
Call to client and left message requesting she reschedule 11/04/2020 appt by calling appt line at [phone]. Jossie Ng, RN

## 2020-11-04 NOTE — Telephone Encounter (Signed)
Erroneous encounter. Annai Heick, RN  

## 2020-12-14 ENCOUNTER — Emergency Department: Payer: Medicaid Other

## 2020-12-14 ENCOUNTER — Emergency Department
Admission: EM | Admit: 2020-12-14 | Discharge: 2020-12-14 | Disposition: A | Discharge status: Home / Self Care | Payer: Medicaid Other | Attending: Emergency Medicine | Admitting: Emergency Medicine

## 2020-12-14 ENCOUNTER — Other Ambulatory Visit: Payer: Self-pay

## 2020-12-14 DIAGNOSIS — Z87891 Personal history of nicotine dependence: Secondary | ICD-10-CM | POA: Insufficient documentation

## 2020-12-14 DIAGNOSIS — M79672 Pain in left foot: Secondary | ICD-10-CM | POA: Insufficient documentation

## 2020-12-14 NOTE — ED Triage Notes (Signed)
Pt states she dropped some groceries on her left foot yesterday and having pain and swelling since

## 2020-12-14 NOTE — Discharge Instructions (Signed)
Your x-ray was negative.  Keep ice on it, elevate it, use Tylenol 1 g every 8 hours and ibuprofen 600 every 6 hours with food for the next 7 days to help with pain and swelling

## 2020-12-14 NOTE — ED Provider Notes (Signed)
[facility] Emergency Department Provider Note  ____________________________________________   Event Date/Time   First MD Initiated Contact with Patient 12/14/20 1105     (approximate)  I have reviewed the triage vital signs and the nursing notes.   HISTORY  Chief Complaint Foot Pain    HPI Linda Sawyer is a 30 y.o. female with obesity who comes in with concerns for foot pain.  Patient reports carrying groceries and dropping soda cans onto her left foot.  This occurred yesterday.  She is developed pain in that foot since then, worse with walking, better at rest.  Took some ibuprofen with mild relief.          History reviewed. No pertinent past medical history.  Patient Active Problem List   Diagnosis Date Noted   Vaping nicotine dependence, non-tobacco product 02/12/2020   Marijuana abuse daily 02/12/2020   Acetaminophen overdose 06/27/2015   Obesity, unspecified 343 lbs 06/08/2014   Depressive disorder, major recur, unspecified (HCC) 07/27/2006    Past Surgical History:  Procedure Laterality Date   HAND SURGERY      Prior to Admission medications   Medication Sig Start Date End Date Taking? Authorizing Provider  diphenhydrAMINE (BENADRYL ALLERGY) 25 MG tablet Take 1 tablet (25 mg total) by mouth every 6 (six) hours as needed. Patient not taking: No sig reported 11/05/18   Triplett, Rulon Eisenmenger B, FNP  fluticasone (FLONASE) 50 MCG/ACT nasal spray Place 2 sprays into both nostrils daily. 04/13/18 04/13/19  Enid Derry, PA-C  HYDROcodone-acetaminophen (NORCO/VICODIN) 5-325 MG tablet Take 1-2 tablets by mouth every 6 (six) hours as needed for moderate pain or severe pain. Patient not taking: No sig reported 11/01/18   Shaune Pollack, MD  ibuprofen (ADVIL) 600 MG tablet Take 1 tablet (600 mg total) by mouth every 8 (eight) hours as needed for moderate pain. Patient not taking: No sig reported 11/01/18   Shaune Pollack, MD  ketorolac (TORADOL) 10 MG  tablet Take 1 tablet (10 mg total) by mouth every 6 (six) hours as needed. Patient not taking: No sig reported 11/05/18   Kem Boroughs B, FNP  metoCLOPramide (REGLAN) 10 MG tablet Take 1 tablet (10 mg total) by mouth every 6 (six) hours as needed for nausea. Patient not taking: No sig reported 11/05/18   Kem Boroughs B, FNP  Multiple Vitamins-Minerals (MULTIVITAMIN) tablet Take 1 tablet by mouth daily. Patient not taking: No sig reported 07/03/19   Federico Flake, MD  norelgestromin-ethinyl estradiol [employer]) 150-35 MCG/24HR transdermal patch Place 1 patch onto the skin once a week. Patient not taking: No sig reported 04/02/19   Matt Holmes, PA  norelgestromin-ethinyl estradiol [employer]) 150-35 MCG/24HR transdermal patch Place 1 patch onto the skin once a week for 3 doses. 02/12/20 02/27/20  Sciora, Austin Miles, CNM  norelgestromin-ethinyl estradiol [employer]) 150-35 MCG/24HR transdermal patch Place 1 patch onto the skin once a week. Patient not taking: Reported on 10/21/2020 05/18/20   Matt Holmes, PA  predniSONE (STERAPRED UNI-PAK 21 TAB) 10 MG (21) TBPK tablet Take 6 tablets on the first day and decrease by 1 tablet each day until finished. Patient not taking: No sig reported 11/05/18   Kem Boroughs B, FNP    Allergies Valium [diazepam] and Chocolate  Family History  Problem Relation Age of Onset   Diabetes Mother    Hypertension Mother    Asthma Brother    Depression Brother    Hodgkin's lymphoma Maternal Aunt    Hypertension Sister  Diabetes Maternal Grandmother    Lung cancer Paternal Grandmother    Pancreatic cancer Paternal Grandfather     Social History Social History   Tobacco Use   Smoking status: Former    Pack years: 0.00    Types: E-cigarettes, Cigarettes    Quit date: 03/01/2015    Years since quitting: 5.7   Smokeless tobacco: Never  Vaping Use   Vaping Use: Never used  Substance Use Topics   Alcohol use: Not Currently   Drug use: Yes     Frequency: 7.0 times per week    Types: Marijuana      Review of Systems Constitutional: No fever/chills Eyes: No visual changes. ENT: No sore throat. Cardiovascular: Denies chest pain. Respiratory: Denies shortness of breath. Gastrointestinal: No abdominal pain.  No nausea, no vomiting.  No diarrhea.  No constipation. Genitourinary: Negative for dysuria. Musculoskeletal: Negative for back pain.  Foot pain Skin: Negative for rash. Neurological: Negative for headaches, focal weakness or numbness. All other ROS negative ____________________________________________   PHYSICAL EXAM:  VITAL SIGNS: ED Triage Vitals  Enc Vitals Group     BP 12/14/20 1041 123/81     Pulse Rate 12/14/20 1041 65     Resp 12/14/20 1041 16     Temp 12/14/20 1041 98.6 F (37 C)     Temp Source 12/14/20 1041 Oral     SpO2 12/14/20 1041 100 %     Weight 12/14/20 1044 (!) 343 lb 14.7 oz (156 kg)     Height 12/14/20 1042 5\' 10"  (1.778 m)     Head Circumference --      Peak Flow --      Pain Score 12/14/20 1042 8     Pain Loc --      Pain Edu? --      Excl. in GC? --     Constitutional: Alert and oriented. Well appearing and in no acute distress. Eyes: Conjunctivae are normal. EOMI. Head: Atraumatic. Nose: No congestion/rhinnorhea. Mouth/Throat: Mucous membranes are moist.   Neck: No stridor. Trachea Midline. FROM Cardiovascular: Normal rate, regular rhythm. Grossly normal heart sounds.  Good peripheral circulation. Respiratory: Normal respiratory effort.  No retractions. Lungs CTAB. Gastrointestinal: Soft and nontender. No distention. No abdominal bruits.  Musculoskeletal: Very small bruising noted to the right foot on the medial side beneath the big toe.  2+ distal pulse.  No significant tenderness in the ankle or in the fibula. Neurologic:  Normal speech and language. No gross focal neurologic deficits are appreciated.  Skin:  Skin is warm, dry and intact. No rash noted. Psychiatric: Mood and  affect are normal. Speech and behavior are normal. GU: Deferred   _ RADIOLOGY I, 02/14/21, personally viewed and evaluated these images (plain radiographs) as part of my medical decision making, as well as reviewing the written report by the radiologist.  ED MD interpretation: No fracture  Official radiology report(s): DG Foot Complete Left  Result Date: 12/14/2020 CLINICAL DATA:  02/14/2021.  Left foot pain. EXAM: LEFT FOOT - COMPLETE 3+ VIEW COMPARISON:  None. FINDINGS: The joint spaces are maintained.  No acute fracture is identified. IMPRESSION: No acute bony findings. Electronically Signed   By: Larey Seat M.D.   On: 12/14/2020 11:26    ____________________________________________   PROCEDURES  Procedure(s) performed (including Critical Care):  Procedures   ____________________________________________   INITIAL IMPRESSION / ASSESSMENT AND PLAN / ED COURSE  Doha J Stetson was evaluated in Emergency Department on 12/14/2020 for the symptoms  described in the history of present illness. She was evaluated in the context of the global COVID-19 pandemic, which necessitated consideration that the patient might be at risk for infection with the SARS-CoV-2 virus that causes COVID-19. Institutional protocols and algorithms that pertain to the evaluation of patients at risk for COVID-19 are in a state of rapid change based on information released by regulatory bodies including the CDC and federal and state organizations. These policies and algorithms were followed during the patient's care in the ED.    Patient comes in with foot pain after dropping soda cans on it.  X-ray ordered to evaluate for dislocation, fracture.  Currently neurovascularly intact  X-ray is negative.  Encouraged Tylenol ibuprofen for pain        ____________________________________________   FINAL CLINICAL IMPRESSION(S) / ED DIAGNOSES   Final diagnoses:  Foot pain, left      MEDICATIONS GIVEN DURING THIS  VISIT:  Medications - No data to display   ED Discharge Orders     None        Note:  This document was prepared using Dragon voice recognition software and may include unintentional dictation errors.    Concha Se, MD 12/14/20 1145

## 2021-01-13 ENCOUNTER — Emergency Department
Admission: EM | Admit: 2021-01-13 | Discharge: 2021-01-13 | Disposition: A | Discharge status: Home / Self Care | Payer: Medicaid Other | Attending: Emergency Medicine | Admitting: Emergency Medicine

## 2021-01-13 ENCOUNTER — Other Ambulatory Visit: Payer: Self-pay

## 2021-01-13 ENCOUNTER — Encounter: Payer: Self-pay | Admitting: Emergency Medicine

## 2021-01-13 DIAGNOSIS — Z87891 Personal history of nicotine dependence: Secondary | ICD-10-CM | POA: Insufficient documentation

## 2021-01-13 DIAGNOSIS — J020 Streptococcal pharyngitis: Secondary | ICD-10-CM | POA: Insufficient documentation

## 2021-01-13 DIAGNOSIS — J039 Acute tonsillitis, unspecified: Secondary | ICD-10-CM

## 2021-01-13 LAB — GROUP A STREP BY PCR: Group A Strep by PCR: DETECTED — AB

## 2021-01-13 MED ORDER — PENICILLIN G BENZATHINE 1200000 UNIT/2ML IM SUSY
1.2000 10*6.[IU] | PREFILLED_SYRINGE | Freq: Once | INTRAMUSCULAR | Status: AC
  Administered 2021-01-13: 1.2 10*6.[IU] via INTRAMUSCULAR
  Filled 2021-01-13: qty 2

## 2021-01-13 MED ORDER — LIDOCAINE VISCOUS HCL 2 % MT SOLN
15.0000 mL | Freq: Once | OROMUCOSAL | Status: AC
  Administered 2021-01-13: 15 mL via OROMUCOSAL
  Filled 2021-01-13: qty 15

## 2021-01-13 MED ORDER — DEXAMETHASONE SODIUM PHOSPHATE 10 MG/ML IJ SOLN
10.0000 mg | Freq: Once | INTRAMUSCULAR | Status: AC
  Administered 2021-01-13: 10 mg via INTRAMUSCULAR
  Filled 2021-01-13: qty 1

## 2021-01-13 MED ORDER — HYDROCODONE-ACETAMINOPHEN 7.5-325 MG/15ML PO SOLN: 15.0000 mL | Freq: Three times a day (TID) | ORAL | 0 refills | Status: AC | PRN

## 2021-01-13 MED ORDER — PREDNISONE 20 MG PO TABS: 20.0000 mg | ORAL_TABLET | Freq: Two times a day (BID) | ORAL | 0 refills | Status: AC

## 2021-01-13 MED ORDER — LIDOCAINE VISCOUS HCL 2 % MT SOLN: 15.0000 mL | OROMUCOSAL | 0 refills | Status: DC | PRN

## 2021-01-13 NOTE — ED Triage Notes (Signed)
Pt comes into the ED via POV c/o sore trhoat x 2 days.  Pt denies any other symptoms at this time.  Pt in NAD.

## 2021-01-13 NOTE — ED Notes (Signed)
See triage note  Presents with sore throat for about 2 days  Increased pain with swallowing  Afebrile on arrival

## 2021-01-13 NOTE — Discharge Instructions (Addendum)
You have been treated for strep throat with a single dose of penicillin.  Take the pain medicine as needed and the steroid as directed.  Follow-up with [facility] for ongoing symptoms.  Return to the ED for difficulty breathing or swallowing.

## 2021-01-13 NOTE — ED Provider Notes (Signed)
[facility] Emergency Department Provider Note ____________________________________________  Time seen: 0915  I have reviewed the triage vital signs and the nursing notes.  HISTORY  Chief Complaint  Sore Throat   HPI Linda Sawyer is a 30 y.o. female presents to the ED with 2-day complaint of sore throat.  She denies any other symptoms including fever, nausea, vomiting, or difficulty controlling secretions.  History reviewed. No pertinent past medical history.  Patient Active Problem List   Diagnosis Date Noted   Vaping nicotine dependence, non-tobacco product 02/12/2020   Marijuana abuse daily 02/12/2020   Acetaminophen overdose 06/27/2015   Obesity, unspecified 343 lbs 06/08/2014   Depressive disorder, major recur, unspecified (HCC) 07/27/2006    Past Surgical History:  Procedure Laterality Date   HAND SURGERY      Prior to Admission medications   Medication Sig Start Date End Date Taking? Authorizing Provider  HYDROcodone-acetaminophen (HYCET) 7.5-325 mg/15 ml solution Take 15 mLs by mouth 3 (three) times daily as needed for up to 10 days for moderate pain. 01/13/21 01/23/21 Yes Camauri Craton, Charlesetta Ivory, PA-C  lidocaine (XYLOCAINE) 2 % solution Use as directed 15 mLs in the mouth or throat every 4 (four) hours as needed for mouth pain (Gargle & spit). 01/13/21  Yes Kiwanna Spraker, Charlesetta Ivory, PA-C  predniSONE (DELTASONE) 20 MG tablet Take 1 tablet (20 mg total) by mouth 2 (two) times daily with a meal for 5 days. 01/13/21 01/18/21 Yes Zyshawn Bohnenkamp, Charlesetta Ivory, PA-C  fluticasone (FLONASE) 50 MCG/ACT nasal spray Place 2 sprays into both nostrils daily. 04/13/18 04/13/19  Enid Derry, PA-C  ibuprofen (ADVIL) 600 MG tablet Take 1 tablet (600 mg total) by mouth every 8 (eight) hours as needed for moderate pain. Patient not taking: No sig reported 11/01/18   Shaune Pollack, MD  norelgestromin-ethinyl estradiol [employer]) 150-35 MCG/24HR transdermal patch Place 1 patch onto  the skin once a week for 3 doses. 02/12/20 02/27/20  Sciora, Austin Miles, CNM  norelgestromin-ethinyl estradiol [employer]) 150-35 MCG/24HR transdermal patch Place 1 patch onto the skin once a week. Patient not taking: Reported on 10/21/2020 05/18/20   Matt Holmes, PA    Allergies Valium [diazepam] and Chocolate  Family History  Problem Relation Age of Onset   Diabetes Mother    Hypertension Mother    Asthma Brother    Depression Brother    Hodgkin's lymphoma Maternal Aunt    Hypertension Sister    Diabetes Maternal Grandmother    Lung cancer Paternal Grandmother    Pancreatic cancer Paternal Grandfather     Social History Social History   Tobacco Use   Smoking status: Former    Types: E-cigarettes, Cigarettes    Quit date: 03/01/2015    Years since quitting: 5.8   Smokeless tobacco: Never  Vaping Use   Vaping Use: Never used  Substance Use Topics   Alcohol use: Not Currently   Drug use: Yes    Frequency: 7.0 times per week    Types: Marijuana    Review of Systems  Constitutional: Negative for fever. Eyes: Negative for visual changes. ENT: Positive for sore throat. Cardiovascular: Negative for chest pain. Respiratory: Negative for shortness of breath. Gastrointestinal: Negative for abdominal pain, vomiting and diarrhea. Genitourinary: Negative for dysuria. Musculoskeletal: Negative for back pain. Skin: Negative for rash. Neurological: Negative for headaches, focal weakness or numbness. ____________________________________________  PHYSICAL EXAM:  VITAL SIGNS: ED Triage Vitals  Enc Vitals Group     BP 01/13/21 0744 135/85  Pulse Rate 01/13/21 0744 82     Resp 01/13/21 0744 18     Temp 01/13/21 0744 98.9 F (37.2 C)     Temp Source 01/13/21 0744 Oral     SpO2 01/13/21 0744 100 %     Weight 01/13/21 0740 (!) 343 lb 14.7 oz (156 kg)     Height 01/13/21 0740 5\' 10"  (1.778 m)     Head Circumference --      Peak Flow --      Pain Score 01/13/21 0740 10      Pain Loc --      Pain Edu? --      Excl. in GC? --     Constitutional: Alert and oriented. Well appearing and in no distress. Head: Normocephalic and atraumatic. Eyes: Conjunctivae are normal. PERRL. Normal extraocular movements Mouth/Throat: Mucous membranes are moist.  Uvula is midline but tonsils are erythematous, and enlarged with exudates noted.  The right tonsil is slightly larger than the left. Neck: Supple. No thyromegaly. Hematological/Lymphatic/Immunological: No cervical lymphadenopathy. Cardiovascular: Normal rate, regular rhythm. Normal distal pulses. Respiratory: Normal respiratory effort.  Musculoskeletal: Nontender with normal range of motion in all extremities.  Neurologic:  Normal gait without ataxia. Normal speech and language. No gross focal neurologic deficits are appreciated. Skin:  Skin is warm, dry and intact. No rash noted. Psychiatric: Mood and affect are normal. Patient exhibits appropriate insight and judgment. ____________________________________________   LABS (pertinent positives/negatives)  Labs Reviewed  GROUP A STREP BY PCR - Abnormal; Notable for the following components:      Result Value   Group A Strep by PCR DETECTED (*)    All other components within normal limits   ____________________________________________  EKG  ____________________________________________   RADIOLOGY Official radiology report(s): No results found. ____________________________________________  PROCEDURES  Penicillin G benzathine 1,200,000 units IM Decadron 10 mg IM Lidocaine 2% viscous solution gargle  Procedures ____________________________________________   INITIAL IMPRESSION / ASSESSMENT AND PLAN / ED COURSE  As part of my medical decision making, I reviewed the following data within the electronic MEDICAL RECORD NUMBER Labs reviewed as above, Notes from prior ED visits, and [place] Controlled Substance Database        Patient ED evaluation of 2 days of sore  throat.  Patient was evaluated for symptoms in the ED, found to have a clinical suspicion for tonsillitis.  Strep PCR was positive at this time, patient opted for single IM dose of [place] in the ED.  She is discharged with ongoing prescription for oral prednisone, small prescription for hydrocodone elixir, and viscous lidocaine solution.  She will follow-up with primary provider, [facility], or return to the ED for difficulty breathing, swallowing, or controlling oral secretions.   Linda Sawyer was evaluated in Emergency Department on 01/13/2021 for the symptoms described in the history of present illness. She was evaluated in the context of the global COVID-19 pandemic, which necessitated consideration that the patient might be at risk for infection with the SARS-CoV-2 virus that causes COVID-19. Institutional protocols and algorithms that pertain to the evaluation of patients at risk for COVID-19 are in a state of rapid change based on information released by regulatory bodies including the CDC and federal and state organizations. These policies and algorithms were followed during the patient's care in the ED.  I reviewed the patient's prescription history over the last 12 months in the multi-state controlled substances database(s) that includes [place], [place], [place], [place], [place], [place], [place], [place], [place], [place], [place], [place], [place], and [place].  Results were notable for no current RX. ____________________________________________  FINAL CLINICAL IMPRESSION(S) / ED DIAGNOSES  Final diagnoses:  Strep throat  Tonsillitis      Joy Reiger, Charlesetta Ivory, PA-C 01/13/21 1049    Merwyn Katos, MD 01/14/21 [phone]

## 2021-02-07 ENCOUNTER — Other Ambulatory Visit: Payer: Self-pay

## 2021-02-07 ENCOUNTER — Encounter: Payer: Self-pay | Admitting: Family Medicine

## 2021-02-07 ENCOUNTER — Ambulatory Visit: Payer: Self-pay | Admitting: Family Medicine

## 2021-02-07 DIAGNOSIS — Z113 Encounter for screening for infections with a predominantly sexual mode of transmission: Secondary | ICD-10-CM

## 2021-02-07 DIAGNOSIS — Z7251 High risk heterosexual behavior: Secondary | ICD-10-CM

## 2021-02-07 LAB — WET PREP FOR TRICH, YEAST, CLUE
Trichomonas Exam: NEGATIVE
Yeast Exam: NEGATIVE

## 2021-02-07 MED ORDER — PREPLUS 27-1 MG PO TABS: 1.0000 | ORAL_TABLET | Freq: Every day | ORAL | 0 refills | Status: DC

## 2021-02-07 NOTE — Progress Notes (Signed)
[facility] Department STI clinic/screening visit  Subjective:  Linda Sawyer is a 30 y.o. female being seen today for an STI screening visit. The patient reports they do not have symptoms.  Patient reports that they do desire a pregnancy in the next year.   They reported they are not interested in discussing contraception today.  Patient's last menstrual period was 01/04/2021 (approximate).   Patient has the following medical conditions:   Patient Active Problem List   Diagnosis Date Noted   Vaping nicotine dependence, non-tobacco product 02/12/2020   Marijuana abuse daily 02/12/2020   Acetaminophen overdose 06/27/2015   Obesity, unspecified 343 lbs 06/08/2014   Depressive disorder, major recur, unspecified (HCC) 07/27/2006    Chief Complaint  Patient presents with   SEXUALLY TRANSMITTED DISEASE    screening    HPI  Patient reports no symptoms. She has new partner and wants testing.   Last HIV test per patient/review of record was May 2022 Patient reports last pap was > 3 years ago.   Health Maintenance Due  Topic Date Due   COVID-19 Vaccine (1) Never done   PAP SMEAR-Modifier  07/10/2020   INFLUENZA VACCINE  01/10/2021     See flowsheet for further details and programmatic requirements.    The following portions of the patient's history were reviewed and updated as appropriate: allergies, current medications, past medical history, past social history, past surgical history and problem list.  Objective:  There were no vitals filed for this visit.  Physical Exam Vitals and nursing note reviewed.  Constitutional:      Appearance: Normal appearance.  HENT:     Head: Normocephalic and atraumatic.     Mouth/Throat:     Mouth: Mucous membranes are moist.     Pharynx: Oropharynx is clear. No oropharyngeal exudate or posterior oropharyngeal erythema.  Pulmonary:     Effort: Pulmonary effort is normal.  Abdominal:     General: Abdomen is flat.      Palpations: There is no mass.     Tenderness: There is no abdominal tenderness. There is no rebound.  Genitourinary:    General: Normal vulva.     Exam position: Lithotomy position.     Pubic Area: No rash or pubic lice.      Labia:        Right: No rash or lesion.        Left: No rash or lesion.      Vagina: Normal. No vaginal discharge, erythema, bleeding or lesions.     Cervix: No cervical motion tenderness, discharge, friability, lesion or erythema.     Uterus: Normal.      Adnexa: Right adnexa normal and left adnexa normal.     Rectum: Normal.  Lymphadenopathy:     Head:     Right side of head: No preauricular or posterior auricular adenopathy.     Left side of head: No preauricular or posterior auricular adenopathy.     Cervical: No cervical adenopathy.     Upper Body:     Right upper body: No supraclavicular or axillary adenopathy.     Left upper body: No supraclavicular or axillary adenopathy.     Lower Body: No right inguinal adenopathy. No left inguinal adenopathy.  Skin:    General: Skin is warm and dry.     Findings: No rash.  Neurological:     Mental Status: She is alert and oriented to person, place, and time.     Assessment and Plan:  Linda Sawyer is a 30 y.o. female presenting to the [facility] Department for STI screening  1. Screening examination for venereal disease Patient accepted all screenings including vaginal CT/GC and bloodwork for HIV/RPR.  Patient meets criteria for HepB screening? No. Ordered? No - Patient was screened recently and has since stopped MJ use Patient meets criteria for HepC screening? Yes. Ordered? No - recently tested in May 2022 and stopped MJ use.   Treat wet prep per standing order Discussed time line for State Lab results and that patient will be called with positive results and encouraged patient to call if she had not heard in 2 weeks.  Counseled to return or seek care for continued or worsening  symptoms Recommended condom use with all sex  Patient is currently using  nothing  to prevent pregnancy. Desires pregnancy  - WET PREP FOR TRICH, YEAST, CLUE - Chlamydia/Gonorrhea [facility] - HIV [place] [facility] - Syphilis Serology, [place] [facility]  2. Unprotected sex - Desires pregnancy - Prenatal Vit-Fe Fumarate-FA (PREPLUS) 27-1 MG TABS; Take 1 tablet by mouth daily.  Dispense: 30 tablet; Refill: 0   Return for pap, family planning visit.  No future appointments.  Federico Flake, MD

## 2021-02-07 NOTE — Progress Notes (Signed)
No s/s per patient. Wet mount reviewed. Prenatal vitamins given and documented. Providers orders completed.

## 2021-04-13 ENCOUNTER — Other Ambulatory Visit: Payer: Self-pay

## 2021-04-13 ENCOUNTER — Emergency Department
Admission: EM | Admit: 2021-04-13 | Discharge: 2021-04-13 | Disposition: A | Discharge status: Home / Self Care | Payer: Medicaid Other | Attending: Emergency Medicine | Admitting: Emergency Medicine

## 2021-04-13 ENCOUNTER — Encounter: Payer: Self-pay | Admitting: Emergency Medicine

## 2021-04-13 ENCOUNTER — Emergency Department: Payer: Medicaid Other

## 2021-04-13 DIAGNOSIS — R61 Generalized hyperhidrosis: Secondary | ICD-10-CM | POA: Insufficient documentation

## 2021-04-13 DIAGNOSIS — U071 COVID-19: Secondary | ICD-10-CM | POA: Insufficient documentation

## 2021-04-13 DIAGNOSIS — Z5321 Procedure and treatment not carried out due to patient leaving prior to being seen by health care provider: Secondary | ICD-10-CM | POA: Insufficient documentation

## 2021-04-13 DIAGNOSIS — R059 Cough, unspecified: Secondary | ICD-10-CM | POA: Insufficient documentation

## 2021-04-13 DIAGNOSIS — R0602 Shortness of breath: Secondary | ICD-10-CM | POA: Insufficient documentation

## 2021-04-13 DIAGNOSIS — R079 Chest pain, unspecified: Secondary | ICD-10-CM | POA: Insufficient documentation

## 2021-04-13 LAB — CBC
HCT: 37 % (ref 36.0–46.0)
Hemoglobin: 12 g/dL (ref 12.0–15.0)
MCH: 22.1 pg — ABNORMAL LOW (ref 26.0–34.0)
MCHC: 32.4 g/dL (ref 30.0–36.0)
MCV: 68 fL — ABNORMAL LOW (ref 80.0–100.0)
Platelets: 326 10*3/uL (ref 150–400)
RBC: 5.44 MIL/uL — ABNORMAL HIGH (ref 3.87–5.11)
RDW: 16.8 % — ABNORMAL HIGH (ref 11.5–15.5)
WBC: 9.3 10*3/uL (ref 4.0–10.5)
nRBC: 0 % (ref 0.0–0.2)

## 2021-04-13 LAB — BASIC METABOLIC PANEL
Anion gap: 6 (ref 5–15)
BUN: 7 mg/dL (ref 6–20)
CO2: 28 mmol/L (ref 22–32)
Calcium: 9.1 mg/dL (ref 8.9–10.3)
Chloride: 102 mmol/L (ref 98–111)
Creatinine, Ser: 0.75 mg/dL (ref 0.44–1.00)
GFR, Estimated: >60 mL/min (ref >60)
Glucose, Bld: 99 mg/dL (ref 70–99)
Potassium: 4 mmol/L (ref 3.5–5.1)
Sodium: 136 mmol/L (ref 135–145)

## 2021-04-13 LAB — D-DIMER, QUANTITATIVE: D-Dimer, Quant: 0.79 ug/mL-FEU — ABNORMAL HIGH (ref 0.00–0.50)

## 2021-04-13 LAB — TROPONIN I (HIGH SENSITIVITY): Troponin I (High Sensitivity): 3 ng/L (ref <18)

## 2021-04-13 NOTE — ED Triage Notes (Signed)
Pt reports that she has had chest pain since last evening. No radiation, but does hurt when she takes a deep breath. No N/V did have some diaphoresis. She tested positive for COVID last Tuesday, was feeling better on Sunday then last night developed the cough, SHOB and chest pain.

## 2021-04-29 ENCOUNTER — Emergency Department: Payer: Self-pay

## 2021-04-29 ENCOUNTER — Emergency Department
Admission: EM | Admit: 2021-04-29 | Discharge: 2021-04-29 | Disposition: A | Discharge status: Home / Self Care | Payer: Self-pay | Attending: Emergency Medicine | Admitting: Emergency Medicine

## 2021-04-29 ENCOUNTER — Other Ambulatory Visit: Payer: Self-pay

## 2021-04-29 ENCOUNTER — Encounter: Payer: Self-pay | Admitting: Emergency Medicine

## 2021-04-29 DIAGNOSIS — R1011 Right upper quadrant pain: Secondary | ICD-10-CM | POA: Insufficient documentation

## 2021-04-29 DIAGNOSIS — R000 Tachycardia, unspecified: Secondary | ICD-10-CM | POA: Insufficient documentation

## 2021-04-29 DIAGNOSIS — Z87891 Personal history of nicotine dependence: Secondary | ICD-10-CM | POA: Insufficient documentation

## 2021-04-29 DIAGNOSIS — R1010 Upper abdominal pain, unspecified: Secondary | ICD-10-CM

## 2021-04-29 LAB — COMPREHENSIVE METABOLIC PANEL
ALT: 18 U/L (ref 0–44)
AST: 25 U/L (ref 15–41)
Albumin: 3.8 g/dL (ref 3.5–5.0)
Alkaline Phosphatase: 54 U/L (ref 38–126)
Anion gap: 10 (ref 5–15)
BUN: 8 mg/dL (ref 6–20)
CO2: 22 mmol/L (ref 22–32)
Calcium: 8.9 mg/dL (ref 8.9–10.3)
Chloride: 101 mmol/L (ref 98–111)
Creatinine, Ser: 0.82 mg/dL (ref 0.44–1.00)
GFR, Estimated: >60 mL/min (ref >60)
Glucose, Bld: 105 mg/dL — ABNORMAL HIGH (ref 70–99)
Potassium: 3.3 mmol/L — ABNORMAL LOW (ref 3.5–5.1)
Sodium: 133 mmol/L — ABNORMAL LOW (ref 135–145)
Total Bilirubin: 0.5 mg/dL (ref 0.3–1.2)
Total Protein: 9.1 g/dL — ABNORMAL HIGH (ref 6.5–8.1)

## 2021-04-29 LAB — URINALYSIS, COMPLETE (UACMP) WITH MICROSCOPIC
Bilirubin Urine: NEGATIVE
Glucose, UA: NEGATIVE mg/dL
Ketones, ur: 20 mg/dL — AB
Leukocytes,Ua: NEGATIVE
Nitrite: NEGATIVE
Protein, ur: 100 mg/dL — AB
Specific Gravity, Urine: 1.025 (ref 1.005–1.030)
pH: 6 (ref 5.0–8.0)

## 2021-04-29 LAB — CBC
HCT: 39.4 % (ref 36.0–46.0)
Hemoglobin: 12.4 g/dL (ref 12.0–15.0)
MCH: 21 pg — ABNORMAL LOW (ref 26.0–34.0)
MCHC: 31.5 g/dL (ref 30.0–36.0)
MCV: 66.8 fL — ABNORMAL LOW (ref 80.0–100.0)
Platelets: 250 10*3/uL (ref 150–400)
RBC: 5.9 MIL/uL — ABNORMAL HIGH (ref 3.87–5.11)
RDW: 16.5 % — ABNORMAL HIGH (ref 11.5–15.5)
WBC: 5.5 10*3/uL (ref 4.0–10.5)
nRBC: 0 % (ref 0.0–0.2)

## 2021-04-29 LAB — LIPASE, BLOOD: Lipase: 36 U/L (ref 11–51)

## 2021-04-29 LAB — POC URINE PREG, ED: Preg Test, Ur: NEGATIVE

## 2021-04-29 MED ORDER — ONDANSETRON HCL 4 MG/2ML IJ SOLN
4.0000 mg | Freq: Once | INTRAMUSCULAR | Status: AC
  Administered 2021-04-29: 4 mg via INTRAVENOUS
  Filled 2021-04-29: qty 2

## 2021-04-29 MED ORDER — MORPHINE SULFATE (PF) 4 MG/ML IV SOLN
4.0000 mg | Freq: Once | INTRAVENOUS | Status: AC
  Administered 2021-04-29: 4 mg via INTRAVENOUS
  Filled 2021-04-29: qty 1

## 2021-04-29 MED ORDER — SODIUM CHLORIDE 0.9 % IV SOLN
1000.0000 mL | Freq: Once | INTRAVENOUS | Status: AC
  Administered 2021-04-29: 1000 mL via INTRAVENOUS

## 2021-04-29 MED ORDER — DICYCLOMINE HCL 10 MG PO CAPS: 10.0000 mg | ORAL_CAPSULE | Freq: Three times a day (TID) | ORAL | 0 refills | Status: DC

## 2021-04-29 NOTE — ED Triage Notes (Signed)
Pt comes into the ED via POV c/o low abd pain that radiates into the back.  Pt explains that the pain is so bad that it is making her [facility] and she gets even more SHOB with exertion.  Pt denies any N/V/D.  Pt states she did have COVID the 1st of November.  PT denies any urinary symptoms.  Pt ambulatory to triage but does get [facility] with exertion.

## 2021-04-29 NOTE — ED Provider Notes (Signed)
[facility] Emergency Department Provider Note   ____________________________________________    I have reviewed the triage vital signs and the nursing notes.   HISTORY  Chief Complaint Abdominal pain     HPI Linda Sawyer is a 30 y.o. female who presents with complaints of abdominal pain.  Patient describes upper abdominal pain that started relatively suddenly yesterday evening.  She reports the pain is primarily in the right upper quadrant, she has never had this before.  No history of abdominal surgeries.  No fevers or chills.  No nausea or vomiting.  Is not take anything for this.  History reviewed. No pertinent past medical history.  Patient Active Problem List   Diagnosis Date Noted   Vaping nicotine dependence, non-tobacco product 02/12/2020   Marijuana abuse daily 02/12/2020   Acetaminophen overdose 06/27/2015   Obesity, unspecified 343 lbs 06/08/2014   Depressive disorder, major recur, unspecified (HCC) 07/27/2006    Past Surgical History:  Procedure Laterality Date   HAND SURGERY      Prior to Admission medications   Medication Sig Start Date End Date Taking? Authorizing Provider  dicyclomine (BENTYL) 10 MG capsule Take 1 capsule (10 mg total) by mouth 4 (four) times daily -  before meals and at bedtime. 04/29/21  Yes Jene Every, MD  fluticasone (FLONASE) 50 MCG/ACT nasal spray Place 2 sprays into both nostrils daily. 04/13/18 04/13/19  Enid Derry, PA-C  ibuprofen (ADVIL) 600 MG tablet Take 1 tablet (600 mg total) by mouth every 8 (eight) hours as needed for moderate pain. Patient not taking: No sig reported 11/01/18   Shaune Pollack, MD  lidocaine (XYLOCAINE) 2 % solution Use as directed 15 mLs in the mouth or throat every 4 (four) hours as needed for mouth pain (Gargle & spit). 01/13/21   Menshew, Charlesetta Ivory, PA-C  norelgestromin-ethinyl estradiol [facility]ico) 150-35 MCG/24HR transdermal patch Place 1 patch onto the skin once a  week for 3 doses. 02/12/20 02/27/20  Sciora, Austin Miles, CNM  norelgestromin-ethinyl estradiol [employer]ico) 150-35 MCG/24HR transdermal patch Place 1 patch onto the skin once a week. Patient not taking: Reported on 10/21/2020 05/18/20   Matt Holmes, PA  Prenatal Vit-Fe Fumarate-FA (PREPLUS) 27-1 MG TABS Take 1 tablet by mouth daily. 02/07/21   Federico Flake, MD     Allergies Valium [diazepam] and Chocolate  Family History  Problem Relation Age of Onset   Diabetes Mother    Hypertension Mother    Asthma Brother    Depression Brother    Hodgkin's lymphoma Maternal Aunt    Hypertension Sister    Diabetes Maternal Grandmother    Lung cancer Paternal Grandmother    Pancreatic cancer Paternal Grandfather     Social History Social History   Tobacco Use   Smoking status: Former    Types: E-cigarettes, Cigarettes    Quit date: 03/01/2015    Years since quitting: 6.1   Smokeless tobacco: Never  Vaping Use   Vaping Use: Never used  Substance Use Topics   Alcohol use: Not Currently   Drug use: Yes    Frequency: 7.0 times per week    Types: Marijuana    Review of Systems  Constitutional: No fever/chills Eyes: No visual changes.  ENT: No sore throat. Cardiovascular: Denies chest pain. Respiratory: Denies shortness of breath. Gastrointestinal: As above Genitourinary: Negative for dysuria. Musculoskeletal: Negative for back pain. Skin: Negative for rash. Neurological: Negative for headaches or weakness   ____________________________________________   PHYSICAL EXAM:  VITAL SIGNS: ED Triage Vitals  Enc Vitals Group     BP 04/29/21 0818 (!) 102/46     Pulse Rate 04/29/21 0818 (!) 128     Resp 04/29/21 0818 (!) 22     Temp 04/29/21 0818 97.9 F (36.6 C)     Temp Source 04/29/21 0818 Oral     SpO2 04/29/21 0818 98 %     Weight 04/29/21 0819 (!) 156 kg (343 lb 14.7 oz)     Height 04/29/21 0819 1.778 m (5\' 10" )     Head Circumference --      Peak Flow --       Pain Score 04/29/21 0819 10     Pain Loc --      Pain Edu? --      Excl. in GC? --     Constitutional: Alert and oriented.  Eyes: Conjunctivae are normal.  Head: Atraumatic. Nose: No congestion/rhinnorhea. Mouth/Throat: Mucous membranes are moist.   Neck:  Painless ROM Cardiovascular: Normal rate, regular rhythm. Good peripheral circulation. Respiratory: Normal respiratory effort.  No retractions. Lungs CTAB. Gastrointestinal: Soft, mild tenderness palpation right upper quadrant, no distention, no CVA tenderness  Musculoskeletal: Warm and well perfused Neurologic:  Normal speech and language. No gross focal neurologic deficits are appreciated.  Skin:  Skin is warm, dry and intact. No rash noted. Psychiatric: Mood and affect are normal. Speech and behavior are normal.  ____________________________________________   LABS (all labs ordered are listed, but only abnormal results are displayed)  Labs Reviewed  COMPREHENSIVE METABOLIC PANEL - Abnormal; Notable for the following components:      Result Value   Sodium 133 (*)    Potassium 3.3 (*)    Glucose, Bld 105 (*)    Total Protein 9.1 (*)    All other components within normal limits  CBC - Abnormal; Notable for the following components:   RBC 5.90 (*)    MCV 66.8 (*)    MCH 21.0 (*)    RDW 16.5 (*)    All other components within normal limits  URINALYSIS, COMPLETE (UACMP) WITH MICROSCOPIC - Abnormal; Notable for the following components:   Color, Urine YELLOW (*)    APPearance CLOUDY (*)    Hgb urine dipstick SMALL (*)    Ketones, ur 20 (*)    Protein, ur 100 (*)    Bacteria, UA RARE (*)    All other components within normal limits  LIPASE, BLOOD  POC URINE PREG, ED   ____________________________________________  EKG  ED ECG REPORT I, 05/01/21, the attending physician, personally viewed and interpreted this ECG.  Date: 04/29/2021  Rhythm: Sinus tachycardia QRS Axis: normal Intervals: normal ST/T Wave  abnormalities: normal Narrative Interpretation: no evidence of acute ischemia  ____________________________________________  RADIOLOGY  Ultrasound right upper quadrant no evidence of cholecystitis, reviewed by me Chest x-ray without evidence of pneumonia  ____________________________________________   PROCEDURES  Procedure(s) performed: No  Procedures   Critical Care performed: No ____________________________________________   INITIAL IMPRESSION / ASSESSMENT AND PLAN / ED COURSE  Pertinent labs & imaging results that were available during my care of the patient were reviewed by me and considered in my medical decision making (see chart for details).   Patient presents with primary complaint of abdominal pain, significant tenderness in the right upper quadrant concerning for possible cholecystitis/cholelithiasis  Lab work is overall reassuring, normal white blood cell count, normal LFTs, will treat with IV morphine, IV Zofran, IV fluids, sent for ultrasound of the  right upper quadrant and reevaluate.  Right upper quadrant ultrasound without evidence of gallstones, chest x-ray unremarkable  Discussed results with patient, recommended CT abdomen pelvis however patient declined because she is tired and wants to go home.  She knows that she can return anytime.  She does have decisional capacity.      ____________________________________________   FINAL CLINICAL IMPRESSION(S) / ED DIAGNOSES  Final diagnoses:  Upper abdominal pain  Pain of upper abdomen        Note:  This document was prepared using Dragon voice recognition software and may include unintentional dictation errors.    Jene Every, MD 04/29/21 1220

## 2021-04-29 NOTE — ED Notes (Signed)
Blue Top sent down to lab with save tube.

## 2021-04-29 NOTE — ED Notes (Signed)
Dc ppw provided. IV removed. Verbal consent for DC given. PT assisted to WR via wheelchair.

## 2021-06-30 ENCOUNTER — Ambulatory Visit: Payer: Medicaid Other

## 2021-07-29 ENCOUNTER — Encounter: Payer: Self-pay | Admitting: Family Medicine

## 2021-07-29 ENCOUNTER — Other Ambulatory Visit: Payer: Self-pay

## 2021-07-29 ENCOUNTER — Ambulatory Visit: Payer: Medicaid Other

## 2021-07-29 ENCOUNTER — Ambulatory Visit (LOCAL_COMMUNITY_HEALTH_CENTER): Payer: Medicaid Other | Admitting: Family Medicine

## 2021-07-29 VITALS — BP 130/83 | Ht 70.0 in | Wt 355.2 lb

## 2021-07-29 DIAGNOSIS — Z01419 Encounter for gynecological examination (general) (routine) without abnormal findings: Secondary | ICD-10-CM | POA: Diagnosis not present

## 2021-07-29 DIAGNOSIS — Z113 Encounter for screening for infections with a predominantly sexual mode of transmission: Secondary | ICD-10-CM

## 2021-07-29 DIAGNOSIS — Z1272 Encounter for screening for malignant neoplasm of vagina: Secondary | ICD-10-CM | POA: Diagnosis not present

## 2021-07-29 DIAGNOSIS — Z30011 Encounter for initial prescription of contraceptive pills: Secondary | ICD-10-CM | POA: Diagnosis not present

## 2021-07-29 DIAGNOSIS — Z309 Encounter for contraceptive management, unspecified: Secondary | ICD-10-CM | POA: Diagnosis not present

## 2021-07-29 LAB — WET PREP FOR TRICH, YEAST, CLUE
Trichomonas Exam: NEGATIVE
Yeast Exam: NEGATIVE

## 2021-07-29 MED ORDER — NORGESTIM-ETH ESTRAD TRIPHASIC 0.18/0.215/0.25 MG-25 MCG PO TABS: 1.0000 | ORAL_TABLET | Freq: Every day | ORAL | 2 refills | Status: DC

## 2021-07-29 NOTE — Progress Notes (Signed)
tPatient here for STD check and PE, BCM. OCP sent to pharmacy. Wet prep reviewed no tx per standing orders. Condoms

## 2021-07-29 NOTE — Progress Notes (Signed)
[facility] DEPARTMENT [facility] [address] Main Number: [phone]  Family Planning Visit- Repeat Yearly Visit  Subjective:  Linda Sawyer is a 31 y.o. G0P0000  being seen today for an annual wellness visit and to discuss contraception options.   The patient is currently using No Method - No Contraceptive Precautions for pregnancy prevention. Patient does not want a pregnancy in the next year. Patient has the following medical problems: has Acetaminophen overdose; Obesity, unspecified 343 lbs; Depressive disorder, major recur, unspecified (HCC); Vaping nicotine dependence, non-tobacco product; and Marijuana abuse daily on their problem list.  Chief Complaint  Patient presents with   Annual Exam   Contraception    Patient reports here for PE and wants to start [facility].  Also reports abnormal menses and weight gain.   Patient denies any other concern.    See flowsheet for other program required questions.   Body mass index is 50.97 kg/m. - Patient is eligible for diabetes screening based on BMI and age >48?  not applicable HA1C ordered? not applicable  Patient reports 2 of partners in last year. Desires STI screening?  Yes   Has patient been screened once for HCV in the past?  No  No results found for: HCVAB  Does the patient have current of drug use, have a partner with drug use, and/or has been incarcerated since last result? Yes  If yes-- Screen for HCV through [facility]   Does the patient meet criteria for HBV testing? Yes  Criteria:  -Household, sexual or needle sharing contact with HBV -History of drug use -HIV positive -Those with known Hep C   Health Maintenance Due  Topic Date Due   COVID-19 Vaccine (1) Never done   PAP SMEAR-Modifier  07/10/2020   INFLUENZA VACCINE  01/10/2021    Review of Systems  Constitutional:  Negative for chills, fever, malaise/fatigue and weight loss.  HENT:  Negative for congestion, hearing  loss and sore throat.   Eyes:  Negative for blurred vision, double vision and photophobia.  Respiratory:  Negative for shortness of breath.   Cardiovascular:  Negative for chest pain.  Gastrointestinal:  Negative for abdominal pain, blood in stool, constipation, diarrhea, heartburn, nausea and vomiting.  Genitourinary:  Negative for dysuria and frequency.  Musculoskeletal:  Negative for back pain, joint pain and neck pain.  Skin:  Negative for itching and rash.  Neurological:  Negative for dizziness, weakness and headaches.  Endo/Heme/Allergies:  Does not bruise/bleed easily.  Psychiatric/Behavioral:  Negative for depression, substance abuse and suicidal ideas.    The following portions of the patient's history were reviewed and updated as appropriate: allergies, current medications, past family history, past medical history, past social history, past surgical history and problem list. Problem list updated.  Objective:   Vitals:   07/29/21 1524  BP: 130/83  Weight: (!) 355 lb 3.2 oz (161.1 kg)  Height: 5\' 10"  (1.778 m)    Physical Exam Vitals and nursing note reviewed.  Constitutional:      Appearance: Normal appearance. She is obese.  HENT:     Head: Normocephalic and atraumatic.     Mouth/Throat:     Mouth: Mucous membranes are moist.     Dentition: Normal dentition. No dental caries.     Pharynx: No oropharyngeal exudate or posterior oropharyngeal erythema.  Eyes:     General: No scleral icterus. Neck:     Thyroid: No thyroid mass, thyromegaly or thyroid tenderness.  Cardiovascular:  Rate and Rhythm: Normal rate and regular rhythm.     Pulses: Normal pulses.     Heart sounds: Murmur heard.  Systolic murmur is present with a grade of 1/6.  Pulmonary:     Effort: Pulmonary effort is normal.     Breath sounds: Normal breath sounds.  Chest:  Breasts:    Tanner Score is 5.     Comments: Breasts:        Right: Normal. No swelling, mass, nipple discharge, skin change  or tenderness.        Left: Normal. No swelling, mass, nipple discharge, skin change or tenderness.   Abdominal:     General: Bowel sounds are normal.     Palpations: Abdomen is soft.     Tenderness: There is no abdominal tenderness.  Genitourinary:    General: Normal vulva.     Rectum: Normal.     Comments: External genitalia without, lice, nits, erythema, edema , lesions or inguinal adenopathy. Vagina with normal mucosa and  bloody discharge and pH equals 4.  Cervix without visual lesions, uterus firm, mobile, non-tender, no masses, CMT adnexal fullness or tenderness.   Musculoskeletal:        General: Normal range of motion.     Cervical back: Normal range of motion and neck supple.  Skin:    General: Skin is warm and dry.  Neurological:     General: No focal deficit present.     Mental Status: She is alert and oriented to person, place, and time.  Psychiatric:        Mood and Affect: Mood normal.        Behavior: Behavior normal.      Assessment and Plan:  Linda Sawyer is a 31 y.o. female G0P0000 presenting to the [facility] Department for an yearly wellness and contraception visit  Contraception counseling: Reviewed all forms of birth control options in the tiered based approach. available including abstinence; over the counter/barrier methods; hormonal contraceptive medication including pill, patch, ring, injection,contraceptive implant, ECP; hormonal and nonhormonal IUDs; permanent sterilization options including vasectomy and the various tubal sterilization modalities. Risks, benefits, and typical effectiveness rates were reviewed.  Questions were answered.  Written information was also given to the patient to review.  Patient desires OCP, this was prescribed for patient. She will follow up in  3 months for surveillance.   She  was told to call with any further questions, or with any concerns about this method of contraception.  Emphasized use of condoms 100% of the  time for STI prevention.  Patient was not offered ECP based on patient has same sex partners.    1. Smear, vaginal, as part of routine gynecological examination Well woman exam  Pap today  CBE today  Discussed weight management and methods to help decrease weight.   - IGP, Aptima HPV  2. Screening examination for venereal disease Patient accepted all screenings including wet prep, vaginal CT/GC and bloodwork for HIV/RPR.   Wet prep results neg    No Treatment needed  Discussed time line for State Lab results and that patient will be called with positive results and encouraged patient to call if she had not heard in 2 weeks.  Counseled to return or seek care for continued or worsening symptoms Recommended condom use with all sex  - HIV [place] [facility] - Chlamydia/Gonorrhea [place] [facility] - WET PREP FOR TRICH, YEAST, CLUE - Syphilis Serology, [place] [facility]  3. Encounter for initial  prescription of contraceptive pills Pt to start Tri-lo - Sprintic and RTC in 3 months for follow up.  Will either change OCP type or will order pills for remainder of year.    - Norgestimate-Ethinyl Estradiol Triphasic 0.18/0.215/0.25 MG-25 MCG tab; Take 1 tablet by mouth daily.  Dispense: 28 tablet; Refill: 2     Return in about 3 months (around 10/26/2021) for OCP follow up .  No future appointments.  Wendi Snipes, FNP

## 2021-08-04 LAB — IGP, APTIMA HPV
HPV Aptima: NEGATIVE
PAP Smear Comment: 0

## 2021-08-09 ENCOUNTER — Other Ambulatory Visit: Payer: Self-pay

## 2021-08-09 ENCOUNTER — Emergency Department: Payer: Self-pay

## 2021-08-09 ENCOUNTER — Emergency Department
Admission: EM | Admit: 2021-08-09 | Discharge: 2021-08-09 | Disposition: A | Discharge status: Home / Self Care | Payer: Self-pay | Attending: Emergency Medicine | Admitting: Emergency Medicine

## 2021-08-09 DIAGNOSIS — R319 Hematuria, unspecified: Secondary | ICD-10-CM

## 2021-08-09 DIAGNOSIS — R109 Unspecified abdominal pain: Secondary | ICD-10-CM | POA: Insufficient documentation

## 2021-08-09 DIAGNOSIS — N390 Urinary tract infection, site not specified: Secondary | ICD-10-CM | POA: Insufficient documentation

## 2021-08-09 LAB — URINALYSIS, ROUTINE W REFLEX MICROSCOPIC
Bilirubin Urine: NEGATIVE
Glucose, UA: NEGATIVE mg/dL
Ketones, ur: NEGATIVE mg/dL
Nitrite: NEGATIVE
Protein, ur: 30 mg/dL — AB
RBC / HPF: >50 RBC/hpf — ABNORMAL HIGH (ref 0–5)
Specific Gravity, Urine: 1.018 (ref 1.005–1.030)
WBC, UA: >50 WBC/hpf — ABNORMAL HIGH (ref 0–5)
pH: 7 (ref 5.0–8.0)

## 2021-08-09 LAB — POC URINE PREG, ED: Preg Test, Ur: NEGATIVE

## 2021-08-09 MED ORDER — CEPHALEXIN 500 MG PO CAPS: 500.0000 mg | ORAL_CAPSULE | Freq: Three times a day (TID) | ORAL | 0 refills | Status: DC

## 2021-08-09 MED ORDER — CEFTRIAXONE SODIUM 1 G IJ SOLR
1.0000 g | Freq: Once | INTRAMUSCULAR | Status: AC
  Administered 2021-08-09: 1 g via INTRAMUSCULAR
  Filled 2021-08-09: qty 10

## 2021-08-09 MED ORDER — KETOROLAC TROMETHAMINE 30 MG/ML IJ SOLN
30.0000 mg | Freq: Once | INTRAMUSCULAR | Status: AC
  Administered 2021-08-09: 30 mg via INTRAMUSCULAR
  Filled 2021-08-09: qty 1

## 2021-08-09 MED ORDER — LIDOCAINE HCL (PF) 1 % IJ SOLN
5.0000 mL | Freq: Once | INTRAMUSCULAR | Status: AC
  Administered 2021-08-09: 5 mL
  Filled 2021-08-09: qty 5

## 2021-08-09 NOTE — Discharge Instructions (Addendum)
Increase fluids to stay hydrated.  Also begin taking the cephalexin 500 mg 3 times a day for the next 10 days.  You may take Tylenol or ibuprofen as needed for pain.  This should improve as your infection improves.  A culture was also done of your urine to see if there are any germs in your urine that will not get better with the antibiotic that you were prescribed.  Return to the emergency department if any severe worsening of your symptoms such as nausea, vomiting or high fevers.

## 2021-08-09 NOTE — ED Triage Notes (Signed)
Pt c/o right lower back pain for the past 2 days , denies injury, pt is ambulatory with a steady gait

## 2021-08-09 NOTE — ED Provider Notes (Signed)
[facility] Provider Note    Event Date/Time   First MD Initiated Contact with Patient 08/09/21 0920     (approximate)   History   Back Pain   HPI  Linda Sawyer is a 31 y.o. female   presents to the ED with complaint of right lower back pain for the last 2 days.  Patient denies any injury and normally does not have any back pain.  She denies any nausea, vomiting, chills or fever.  She still continues to ambulate without any assistance.  Patient does have a history of urinary tract infections in the past but not any infections recently.  Patient has a history of acetaminophen overdose, nicotine dependence and marijuana abuse daily.  Also history of depressive disorder.  She rates her pain as a 10/10.      Physical Exam   Triage Vital Signs: ED Triage Vitals [08/09/21 0842]  Enc Vitals Group     BP 133/87     Pulse Rate (!) 58     Resp 18     Temp 98.1 F (36.7 C)     Temp Source Oral     SpO2 100 %     Weight (!) 355 lb (161 kg)     Height 5\' 10"  (1.778 m)     Head Circumference      Peak Flow      Pain Score 10     Pain Loc      Pain Edu?      Excl. in [place]?     Most recent vital signs: Vitals:   08/09/21 0842  BP: 133/87  Pulse: (!) 58  Resp: 18  Temp: 98.1 F (36.7 C)  SpO2: 100%     General: Awake, no distress.  CV:  Good peripheral perfusion.  Heart regular rate and rhythm. Resp:  Normal effort.  Lungs are clear bilaterally. Abd:  No distention.  Abdomen soft, nontender, bowel sounds normoactive x4 quadrants. Other:  Patient's pain is actually right lower sacral area with minimal flank tenderness.  Range of motion is slow and guarded secondary to discomfort.  Patient is ambulatory without any assistance.   ED Results / Procedures / Treatments   Labs (all labs ordered are listed, but only abnormal results are displayed) Labs Reviewed  URINALYSIS, ROUTINE W REFLEX MICROSCOPIC - Abnormal; Notable for the following components:       Result Value   Color, Urine YELLOW (*)    APPearance CLOUDY (*)    Hgb urine dipstick LARGE (*)    Protein, ur 30 (*)    Leukocytes,Ua SMALL (*)    RBC / HPF >50 (*)    WBC, UA >50 (*)    Bacteria, UA RARE (*)    All other components within normal limits  URINE CULTURE  POC URINE PREG, ED      RADIOLOGY CT Renal study was reviewed and radiology report is negative for stones.    PROCEDURES:  Critical Care performed:   Procedures   MEDICATIONS ORDERED IN ED: Medications  ketorolac (TORADOL) 30 MG/ML injection 30 mg (30 mg Intramuscular Given 08/09/21 1010)  cefTRIAXone (ROCEPHIN) injection 1 g (1 g Intramuscular Given 08/09/21 1109)  lidocaine (PF) (XYLOCAINE) 1 % injection 5 mL (5 mLs Infiltration Given 08/09/21 1109)     IMPRESSION / MDM / ASSESSMENT AND PLAN / ED COURSE  I reviewed the triage vital signs and the nursing notes.   Differential diagnosis includes, but is not limited  to, flank pain, kidney stone, urinary tract infection, muscle strain.  31 year old female presents to the ED with complaint of right lower back pain for the last 2 days but no history of nausea, vomiting, fever or chills.  Patient does have a history of urinary tract infections.  She denies any injury to her back.  Urinalysis was read viewed with greater than 50,000 RBCs and WBCs.  There was rare bacteria appearance was cloudy.  Patient had a negative pregnancy test.  A CT scan was performed and no stones were noted.  I discussed possibility of a early pyelonephritis and patient is agreeable to have a Rocephin injection here in the emergency department before leaving.  A prescription for Keflex 500 mg 3 times daily for 10 days was sent to the pharmacy.  She is encouraged to increase fluids and follow-up with her PCP if any continued problems.  She is to return to the emergency department if any severe worsening of her symptoms such as the fever, chills, nausea or vomiting related to a  pyelonephritis.    FINAL CLINICAL IMPRESSION(S) / ED DIAGNOSES   Final diagnoses:  Urinary tract infection with hematuria, site unspecified     Rx / DC Orders   ED Discharge Orders          Ordered    cephALEXin (KEFLEX) 500 MG capsule  3 times daily        08/09/21 1054             Note:  This document was prepared using Dragon voice recognition software and may include unintentional dictation errors.   Johnn Hai, PA-C 08/09/21 1316    Nena Polio, MD 08/09/21 [phone]

## 2021-08-09 NOTE — ED Notes (Signed)
See triage note  presents with right flank pain   states pain radiates into right leg  pain started 2 days ago   ambulates well denies any injury

## 2021-08-10 LAB — URINE CULTURE: Culture: 30000 — AB

## 2021-09-09 ENCOUNTER — Emergency Department
Admission: EM | Admit: 2021-09-09 | Discharge: 2021-09-09 | Disposition: A | Discharge status: Home / Self Care | Payer: Medicaid Other | Attending: Emergency Medicine | Admitting: Emergency Medicine

## 2021-09-09 ENCOUNTER — Other Ambulatory Visit: Payer: Self-pay

## 2021-09-09 DIAGNOSIS — Z20822 Contact with and (suspected) exposure to covid-19: Secondary | ICD-10-CM | POA: Insufficient documentation

## 2021-09-09 DIAGNOSIS — J020 Streptococcal pharyngitis: Secondary | ICD-10-CM | POA: Insufficient documentation

## 2021-09-09 HISTORY — DX: Cardiac murmur, unspecified: R01.1

## 2021-09-09 LAB — GROUP A STREP BY PCR: Group A Strep by PCR: DETECTED — AB

## 2021-09-09 LAB — RESP PANEL BY RT-PCR (FLU A&B, COVID) ARPGX2
Influenza A by PCR: NEGATIVE
Influenza B by PCR: NEGATIVE
SARS Coronavirus 2 by RT PCR: NEGATIVE

## 2021-09-09 MED ORDER — AMOXICILLIN 500 MG PO CAPS
500.0000 mg | ORAL_CAPSULE | Freq: Three times a day (TID) | ORAL | Status: DC
  Administered 2021-09-09: 500 mg via ORAL
  Filled 2021-09-09: qty 1

## 2021-09-09 MED ORDER — IBUPROFEN 800 MG PO TABS: 800.0000 mg | ORAL_TABLET | Freq: Three times a day (TID) | ORAL | 0 refills | Status: DC | PRN

## 2021-09-09 MED ORDER — IBUPROFEN 800 MG PO TABS
800.0000 mg | ORAL_TABLET | Freq: Once | ORAL | Status: AC
  Administered 2021-09-09: 800 mg via ORAL
  Filled 2021-09-09: qty 1

## 2021-09-09 MED ORDER — AMOXICILLIN 500 MG PO TABS: 500.0000 mg | ORAL_TABLET | Freq: Two times a day (BID) | ORAL | 0 refills | Status: AC

## 2021-09-09 MED ORDER — DEXAMETHASONE 4 MG PO TABS
10.0000 mg | ORAL_TABLET | Freq: Once | ORAL | Status: AC
  Administered 2021-09-09: 10 mg via ORAL
  Filled 2021-09-09: qty 3

## 2021-09-09 MED ORDER — METHYLPREDNISOLONE 4 MG PO TBPK: ORAL_TABLET | ORAL | 0 refills | Status: DC

## 2021-09-09 NOTE — ED Provider Notes (Signed)
? ?[facility] ?Provider Note ? ? ? Event Date/Time  ? First MD Initiated Contact with Patient 09/09/21 1423   ?  (approximate) ? ? ?History  ? ?Sore Throat ? ? ?HPI ? ?Linda Sawyer is a 31 y.o. female no significant past medical history who presents for evaluation of sore throat.  Symptoms started 2 days ago.  She is complaining of sharp constant pain that is worse with swallowing.  The pain is moderate in intensity.  She also has had a mild cough and some nasal congestion.  She denies fever or chills, chest pain or shortness of breath. ?  ? ? ?Past Medical History:  ?Diagnosis Date  ? Heart murmur   ? ? ?Past Surgical History:  ?Procedure Laterality Date  ? HAND SURGERY    ? "i was 5"  ? ? ? ?Physical Exam  ? ?Triage Vital Signs: ?ED Triage Vitals  ?Enc Vitals Group  ?   BP 09/09/21 1400 139/81  ?   Pulse Rate 09/09/21 1400 92  ?   Resp 09/09/21 1400 16  ?   Temp 09/09/21 1400 99.3 ?F (37.4 ?C)  ?   Temp Source 09/09/21 1400 Oral  ?   SpO2 09/09/21 1400 100 %  ?   Weight 09/09/21 1407 (!) 355 lb (161 kg)  ?   Height 09/09/21 1407 5\' 10"  (1.778 m)  ?   Head Circumference --   ?   Peak Flow --   ?   Pain Score 09/09/21 1406 9  ?   Pain Loc --   ?   Pain Edu? --   ?   Excl. in [place]? --   ? ? ?Most recent vital signs: ?Vitals:  ? 09/09/21 1400  ?BP: 139/81  ?Pulse: 92  ?Resp: 16  ?Temp: 99.3 ?F (37.4 ?C)  ?SpO2: 100%  ? ? ? ?Constitutional: Alert and oriented. Well appearing and in no apparent distress. ?HEENT: ?     Head: Normocephalic and atraumatic.    ?     Eyes: Conjunctivae are normal. Sclera is non-icteric.  ?     Mouth/Throat: Mucous membranes are moist.  Bilateral tonsils are swollen, erythematous with exudates, no peritonsillar abscess, floor of the mouth is soft no induration, no trismus ?     Neck: Supple with no signs of meningismus. ?Cardiovascular: Regular rate and rhythm.  ?Respiratory: Normal respiratory effort.  ?Musculoskeletal:  No edema, cyanosis, or erythema of  extremities. ?Neurologic: Normal speech and language. Face is symmetric. Moving all extremities. No gross focal neurologic deficits are appreciated. ?Skin: Skin is warm, dry and intact. No rash noted. ?Psychiatric: Mood and affect are normal. Speech and behavior are normal. ? ?ED Results / Procedures / Treatments  ? ?Labs ?(all labs ordered are listed, but only abnormal results are displayed) ?Labs Reviewed  ?GROUP A STREP BY PCR - Abnormal; Notable for the following components:  ?    Result Value  ? Group A Strep by PCR DETECTED (*)   ? All other components within normal limits  ?RESP PANEL BY RT-PCR (FLU A&B, COVID) ARPGX2  ? ? ? ?EKG ? ?none ? ? ?RADIOLOGY ?none ? ? ?PROCEDURES: ? ?Critical Care performed: No ? ?Procedures ? ? ? ?IMPRESSION / MDM / ASSESSMENT AND PLAN / ED COURSE  ?I reviewed the triage vital signs and the nursing notes. ? ?31 y.o. female no significant past medical history who presents for evaluation of sore throat.  Patient with 2 days of  sore throat.  Has bilateral erythematous, swollen tonsils with exudates.  No signs of peritonsillar abscess or Ludewig's angina.  Strep positive.  COVID and flu negative.  Patient was started on steroid taper, amoxicillin, and given ibuprofen for pain.  Discussed pain control at home and follow-up with primary care doctor.  Discussed my standard return precautions. ? ? ?MEDICATIONS GIVEN IN ED: ?Medications  ?amoxicillin (AMOXIL) capsule 500 mg (has no administration in time range)  ?ibuprofen (ADVIL) tablet 800 mg (has no administration in time range)  ?dexamethasone (DECADRON) tablet 10 mg (has no administration in time range)  ? ? ? ?EMR reviewed: Including Last visit primary care doctor from December 2021 ? ? ? ?FINAL CLINICAL IMPRESSION(S) / ED DIAGNOSES  ? ?Final diagnoses:  ?Strep pharyngitis  ? ? ? ?Rx / DC Orders  ? ?ED Discharge Orders   ? ?      Ordered  ?  ibuprofen (ADVIL) 800 MG tablet  Every 8 hours PRN       ? 09/09/21 1507  ?   methylPREDNISolone (MEDROL DOSEPAK) 4 MG TBPK tablet       ? 09/09/21 1507  ?  amoxicillin (AMOXIL) 500 MG tablet  2 times daily       ? 09/09/21 1507  ? ?  ?  ? ?  ? ? ? ?Note:  This document was prepared using Dragon voice recognition software and may include unintentional dictation errors. ? ? ?Please note:  Patient was evaluated in Emergency Department today for the symptoms described in the history of present illness. Patient was evaluated in the context of the global COVID-19 pandemic, which necessitated consideration that the patient might be at risk for infection with the SARS-CoV-2 virus that causes COVID-19. Institutional protocols and algorithms that pertain to the evaluation of patients at risk for COVID-19 are in a state of rapid change based on information released by regulatory bodies including the CDC and federal and state organizations. These policies and algorithms were followed during the patient's care in the ED.  Some ED evaluations and interventions may be delayed as a result of limited staffing during the pandemic. ? ? ? ? ?  ?Nita Sickle, MD ?09/09/21 1510 ? ?

## 2021-09-09 NOTE — ED Triage Notes (Signed)
Pt to ED for sore throat since yesterday. States she thought it was the pollen but still had it today. States has mild cough and nasal congestion (but always has nasal congestion due to deviated septum). Complains of frontal HA. ? ?Denies SOB, CP.  ? ?Pt ambulatory, in NAD. Swabs sent to lab. ?

## 2022-02-06 ENCOUNTER — Emergency Department: Payer: Self-pay

## 2022-02-06 ENCOUNTER — Encounter: Payer: Self-pay | Admitting: Emergency Medicine

## 2022-02-06 ENCOUNTER — Other Ambulatory Visit: Payer: Self-pay

## 2022-02-06 ENCOUNTER — Emergency Department
Admission: EM | Admit: 2022-02-06 | Discharge: 2022-02-06 | Disposition: A | Discharge status: Home / Self Care | Payer: Self-pay | Attending: Emergency Medicine | Admitting: Emergency Medicine

## 2022-02-06 DIAGNOSIS — S93401A Sprain of unspecified ligament of right ankle, initial encounter: Secondary | ICD-10-CM | POA: Insufficient documentation

## 2022-02-06 DIAGNOSIS — Y9302 Activity, running: Secondary | ICD-10-CM | POA: Insufficient documentation

## 2022-02-06 DIAGNOSIS — X501XXA Overexertion from prolonged static or awkward postures, initial encounter: Secondary | ICD-10-CM | POA: Insufficient documentation

## 2022-02-06 NOTE — ED Provider Notes (Signed)
[facility] Provider Note    Event Date/Time   First MD Initiated Contact with Patient 02/06/22 1031     (approximate)   History   Foot Injury   HPI  Linda Sawyer is a 31 y.o. female past medical history of obesity presents with ankle/foot pain.  Patient was running playing with her kids yesterday when she thinks she twisted the ankle.  Has been having ankle and foot pain since.  Still able to ambulate but with pain.  Took ibuprofen.  Denies numbness tingling weakness.    Past Medical History:  Diagnosis Date   Heart murmur     Patient Active Problem List   Diagnosis Date Noted   Vaping nicotine dependence, non-tobacco product 02/12/2020   Marijuana abuse daily 02/12/2020   Acetaminophen overdose 06/27/2015   Obesity, unspecified 343 lbs 06/08/2014   Depressive disorder, major recur, unspecified (HCC) 07/27/2006     Physical Exam  Triage Vital Signs: ED Triage Vitals  Enc Vitals Group     BP 02/06/22 1029 (!) 139/90     Pulse Rate 02/06/22 1029 75     Resp 02/06/22 1029 20     Temp 02/06/22 1029 99 F (37.2 C)     Temp Source 02/06/22 1029 Oral     SpO2 02/06/22 1029 99 %     Weight 02/06/22 1006 (!) 354 lb 15.1 oz (161 kg)     Height 02/06/22 1006 5\' 10"  (1.778 m)     Head Circumference --      Peak Flow --      Pain Score 02/06/22 1006 8     Pain Loc --      Pain Edu? --      Excl. in [place]? --     Most recent vital signs: Vitals:   02/06/22 1029  BP: (!) 139/90  Pulse: 75  Resp: 20  Temp: 99 F (37.2 C)  SpO2: 99%     General: Awake, no distress.  CV:  Good peripheral perfusion.  Resp:  Normal effort.  Abd:  No distention.  Neuro:             Awake, Alert, Oriented x 3  Other:  Tenderness along the medial malleolus and lateral malleolus with some mild anterior ankle tenderness, no significant deformity or swelling or skin changes, able to range the ankle Negative Thompson test No tenderness over the Achilles  2+ DP  pulse    ED Results / Procedures / Treatments  Labs (all labs ordered are listed, but only abnormal results are displayed) Labs Reviewed - No data to display   EKG     RADIOLOGY X-ray of the right foot interpreted by myself is negative for fracture dislocation   PROCEDURES:  Critical Care performed: No  Procedures    MEDICATIONS ORDERED IN ED: Medications - No data to display   IMPRESSION / MDM / ASSESSMENT AND PLAN / ED COURSE  I reviewed the triage vital signs and the nursing notes.                              Patient's presentation is most consistent with acute complicated illness / injury requiring diagnostic workup.  Differential diagnosis includes, but is not limited to, ankle sprain, ankle fracture, hindfoot fracture, contusion  Patient is a 31 year old female presents with right foot and ankle pain.  Think she rolled the ankle yesterday but is not exactly  sure what happened started having pain after she was running around with her kids.  Has been able to weight-bear.  On exam she has tenderness primarily over the medial malleolus but also has some lateral malleolus tenderness and some anterior ankle tenderness.  She has no significant deformity swelling skin changes of neurovascular intact.  X-ray of the right foot was obtained from triage is negative for fracture.  Will obtain x-ray of the right ankle and treat supportively with Tylenol and NSAIDs.    X-ray of the ankle has no acute fracture.  Suspect ankle sprain.  Recommend RICE.   FINAL CLINICAL IMPRESSION(S) / ED DIAGNOSES   Final diagnoses:  Sprain of right ankle, unspecified ligament, initial encounter     Rx / DC Orders   ED Discharge Orders     None        Note:  This document was prepared using Dragon voice recognition software and may include unintentional dictation errors.   Georga Hacking, MD 02/06/22 1154

## 2022-02-06 NOTE — Discharge Instructions (Signed)
The x-rays of your right foot and ankle did not show any broken bones.  I suspect that you have sprained your ankle.  Please rest ice the ankle and take

## 2022-02-06 NOTE — ED Notes (Signed)
Right ankle swollen and painful.  Pt does not know of any injury to ankle

## 2022-02-06 NOTE — ED Triage Notes (Signed)
Injured right foot last night.  C/O right foot pain

## 2022-08-21 ENCOUNTER — Other Ambulatory Visit: Payer: Self-pay

## 2022-08-21 ENCOUNTER — Encounter: Payer: Self-pay | Admitting: Emergency Medicine

## 2022-08-21 ENCOUNTER — Emergency Department
Admission: EM | Admit: 2022-08-21 | Discharge: 2022-08-21 | Disposition: A | Discharge status: Home / Self Care | Payer: Medicaid Other | Attending: Emergency Medicine | Admitting: Emergency Medicine

## 2022-08-21 DIAGNOSIS — B349 Viral infection, unspecified: Secondary | ICD-10-CM | POA: Insufficient documentation

## 2022-08-21 DIAGNOSIS — Z1152 Encounter for screening for COVID-19: Secondary | ICD-10-CM | POA: Insufficient documentation

## 2022-08-21 LAB — RESP PANEL BY RT-PCR (RSV, FLU A&B, COVID)  RVPGX2
Influenza A by PCR: NEGATIVE
Influenza B by PCR: NEGATIVE
Resp Syncytial Virus by PCR: NEGATIVE
SARS Coronavirus 2 by RT PCR: NEGATIVE

## 2022-08-21 MED ORDER — ONDANSETRON 4 MG PO TBDP: 4.0000 mg | ORAL_TABLET | Freq: Three times a day (TID) | ORAL | 0 refills | Status: DC | PRN

## 2022-08-21 MED ORDER — FLUTICASONE PROPIONATE 50 MCG/ACT NA SUSP: 1.0000 | Freq: Two times a day (BID) | NASAL | 0 refills | Status: DC

## 2022-08-21 MED ORDER — BENZONATATE 100 MG PO CAPS: 100.0000 mg | ORAL_CAPSULE | Freq: Three times a day (TID) | ORAL | 0 refills | Status: DC | PRN

## 2022-08-21 NOTE — ED Triage Notes (Signed)
Pt here with flu-like symptoms. Pt states she is having cough, body aches, and a fever, took Tylenol this morning. Pt denies NVD.

## 2022-08-21 NOTE — ED Provider Notes (Signed)
[facility] Provider Note  Patient Contact: 5:12 PM (approximate)   History   Fever   HPI  Para Linda Sawyer is a 32 y.o. female who presents emergency department complaining of fever, congestion, myalgias, nausea.  Symptoms began today.  Multiple sick contacts.  No increased work of breathing, chest pain, abdominal pain.     Physical Exam   Triage Vital Signs: ED Triage Vitals  Enc Vitals Group     BP 08/21/22 1337 (!) 151/97     Pulse Rate 08/21/22 1337 67     Resp 08/21/22 1337 18     Temp 08/21/22 1337 97.8 F (36.6 C)     Temp Source 08/21/22 1337 Oral     SpO2 08/21/22 1337 98 %     Weight 08/21/22 1338 (!) 354 lb 15.1 oz (161 kg)     Height 08/21/22 1338 '5\' 10"'$  (1.778 m)     Head Circumference --      Peak Flow --      Pain Score 08/21/22 1338 6     Pain Loc --      Pain Edu? --      Excl. in [place]? --     Most recent vital signs: Vitals:   08/21/22 1337  BP: (!) 151/97  Pulse: 67  Resp: 18  Temp: 97.8 F (36.6 C)  SpO2: 98%     General: Alert and in no acute distress. ENT:      Ears:       Nose: No congestion/rhinnorhea.      Mouth/Throat: Mucous membranes are moist. Neck: No stridor. No cervical spine tenderness to palpation.  Cardiovascular:  Good peripheral perfusion Respiratory: Normal respiratory effort without tachypnea or retractions. Lungs CTAB. Good air entry to the bases with no decreased or absent breath sounds Gastrointestinal: Bowel sounds 4 quadrants. Soft and nontender to palpation. No guarding or rigidity. No palpable masses. No distention.  Musculoskeletal: Full range of motion to all extremities.  Neurologic:  No gross focal neurologic deficits are appreciated.  Skin:   No rash noted Other:   ED Results / Procedures / Treatments   Labs (all labs ordered are listed, but only abnormal results are displayed) Labs Reviewed  RESP PANEL BY RT-PCR (RSV, FLU A&B, COVID)  RVPGX2      EKG     RADIOLOGY    No results found.  PROCEDURES:  Critical Care performed: No  Procedures   MEDICATIONS ORDERED IN ED: Medications - No data to display   IMPRESSION / MDM / [place] / ED COURSE  I reviewed the triage vital signs and the nursing notes.                                 Differential diagnosis includes, but is not limited to, viral illness, COVID, flu, norovirus, viral gastroenteritis  Patient's presentation is most consistent with acute presentation with potential threat to life or bodily function.   Patient's diagnosis is consistent with viral illness.  Patient presents emergency department multiple viral symptoms.  Swab was negative based off of community prevalence and symptoms I suspect patient likely has influenza B.  Patient will be given symptom control medications.  In addition to these medicines Tylenol and Motrin at home.  Drink plenty of fluids and rest.  Follow-up primary care as needed.  Return precautions discussed with the patient..  Patient is given ED precautions to  return to the ED for any worsening or new symptoms.     FINAL CLINICAL IMPRESSION(S) / ED DIAGNOSES   Final diagnoses:  Viral illness     Rx / DC Orders   ED Discharge Orders          Ordered    ondansetron (ZOFRAN-ODT) 4 MG disintegrating tablet  Every 8 hours PRN        08/21/22 1721    benzonatate (TESSALON PERLES) 100 MG capsule  3 times daily PRN        08/21/22 1721    fluticasone (FLONASE) 50 MCG/ACT nasal spray  2 times daily        08/21/22 1721             Note:  This document was prepared using Dragon voice recognition software and may include unintentional dictation errors.   Darletta Moll, PA-C 08/21/22 1722    Naaman Plummer, MD 08/22/22 0003

## 2022-09-25 ENCOUNTER — Emergency Department
Admission: EM | Admit: 2022-09-25 | Discharge: 2022-09-25 | Disposition: A | Discharge status: Home / Self Care | Payer: BLUE CROSS/BLUE SHIELD | Attending: Emergency Medicine | Admitting: Emergency Medicine

## 2022-09-25 ENCOUNTER — Other Ambulatory Visit: Payer: Self-pay

## 2022-09-25 ENCOUNTER — Emergency Department: Payer: BLUE CROSS/BLUE SHIELD

## 2022-09-25 DIAGNOSIS — R051 Acute cough: Secondary | ICD-10-CM | POA: Diagnosis present

## 2022-09-25 DIAGNOSIS — Z1152 Encounter for screening for COVID-19: Secondary | ICD-10-CM | POA: Insufficient documentation

## 2022-09-25 DIAGNOSIS — J101 Influenza due to other identified influenza virus with other respiratory manifestations: Secondary | ICD-10-CM | POA: Insufficient documentation

## 2022-09-25 LAB — RESP PANEL BY RT-PCR (RSV, FLU A&B, COVID)  RVPGX2
Influenza A by PCR: NEGATIVE
Influenza B by PCR: NEGATIVE
Resp Syncytial Virus by PCR: NEGATIVE
SARS Coronavirus 2 by RT PCR: NEGATIVE

## 2022-09-25 NOTE — Discharge Instructions (Signed)
Your swab is negative. Your chest xray is clear. Please return for any new, worsening, or change in symptoms or other concerns. It was a pleasure caring for you today.

## 2022-09-25 NOTE — ED Triage Notes (Signed)
Pt here with flu like symptoms. Pt states she is coughing and sneezing. Pt states she felt like she had a fever, took tylenol at 0730.

## 2022-09-25 NOTE — ED Provider Notes (Signed)
[facility] Provider Note    Event Date/Time   First MD Initiated Contact with Patient 09/25/22 1017     (approximate)   History   Influenza   HPI  Linda Sawyer is a 32 y.o. female who presents today for evaluation of cough, nasal congestion, body aches for the past 2 days.  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  She reports that influenza and particular is going around right now.  She reports that she took Tylenol this morning, but she is unsure if she has had a fever.  She denies chest pain, shortness of breath, abdominal pain, nausea, vomiting, diarrhea.  She has not had any dysuria.  No headache or neck pain.  Patient Active Problem List   Diagnosis Date Noted   Vaping nicotine dependence, non-tobacco product 02/12/2020   Marijuana abuse daily 02/12/2020   Acetaminophen overdose 06/27/2015   Obesity, unspecified 343 lbs 06/08/2014   Depressive disorder, major recur, unspecified (HCC) 07/27/2006          Physical Exam   Triage Vital Signs: ED Triage Vitals [09/25/22 1014]  Enc Vitals Group     BP (!) 147/106     Pulse Rate 68     Resp 16     Temp 97.8 F (36.6 C)     Temp Source Oral     SpO2 99 %     Weight (!) 354 lb 15.1 oz (161 kg)     Height  (1.778 m)     Head Circumference      Peak Flow      Pain Score 7     Pain Loc      Pain Edu?      Excl. in [place]?     Most recent vital signs: Vitals:   09/25/22 1014 09/25/22 1016  BP: (!) 147/106 (!) 127/91  Pulse: 68   Resp: 16   Temp: 97.8 F (36.6 C)   SpO2: 99%     Physical Exam Vitals and nursing note reviewed.  Constitutional:      General: Awake and alert. No acute distress.    Appearance: Normal appearance. The patient is obese.  HENT:     Head: Normocephalic and atraumatic.     Mouth: Mucous membranes are moist.  No tonsillar exudate, uvula midline, no voice change or trismus Eyes:     General: PERRL. Normal EOMs        Right eye:  No discharge.        Left eye: No discharge.     Conjunctiva/sclera: Conjunctivae normal.  Cardiovascular:     Rate and Rhythm: Normal rate and regular rhythm.     Pulses: Normal pulses.  Pulmonary:     Effort: Pulmonary effort is normal. No respiratory distress.     Breath sounds: Normal breath sounds.  No increased work of breathing Abdominal:     Abdomen is soft. There is no abdominal tenderness. No rebound or guarding. No distention. Musculoskeletal:        General: No swelling. Normal range of motion.     Cervical back: Normal range of motion and neck supple.  No lymphadenopathy Skin:    General: Skin is warm and dry.     Capillary Refill: Capillary refill takes less than 2 seconds.     Findings: No rash.  Neurological:     Mental Status: The patient is awake and alert.  ED Results / Procedures / Treatments   Labs (all labs ordered are listed, but only abnormal results are displayed) Labs Reviewed  RESP PANEL BY RT-PCR (RSV, FLU A&B, COVID)  RVPGX2     EKG     RADIOLOGY I independently reviewed and interpreted imaging and agree with radiologists findings.     PROCEDURES:  Critical Care performed:   Procedures   MEDICATIONS ORDERED IN ED: Medications - No data to display   IMPRESSION / MDM / ASSESSMENT AND PLAN / ED COURSE  I reviewed the triage vital signs and the nursing notes.   Differential diagnosis includes, but is not limited to, COVID, flu, RSV, bronchitis, pneumonia.  Patient is awake and alert, hemodynamically stable and afebrile.  She has normal oxygen saturation of 99% on room air.  She demonstrates no increased work of breathing.  Her abdomen is soft and nontender throughout, no abdominal complaints, no urinary complaints, do not suspect intra-abdominal pathology at this time.  No tonsillar exudate, cough present, no tender cervical lymphadenopathy, no fever, do not suspect strep pharyngitis.  There are no clinical signs or symptoms of  peritonsillar or retropharyngeal abscess.  She denies sore throat at all.  Her lungs are clear to auscultation bilaterally, however she reports that her cough is productive and she reports that she feels like she has a fever.  Therefore chest x-ray was obtained for evaluation of pneumonia.  This was negative.  Swab is also negative for COVID, flu, RSV.  Symptoms most consistent with other URI.  Patient is reassured by her findings.  Patient understands and agrees with plan.  He was discharged in stable condition.   Patient's presentation is most consistent with acute complicated illness / injury requiring diagnostic workup.     FINAL CLINICAL IMPRESSION(S) / ED DIAGNOSES   Final diagnoses:  Acute cough     Rx / DC Orders   ED Discharge Orders     None        Note:  This document was prepared using Dragon voice recognition software and may include unintentional dictation errors.   Keturah Shavers 09/25/22 1431    Georga Hacking, MD 09/25/22 [phone]

## 2022-10-06 ENCOUNTER — Ambulatory Visit: Payer: BLUE CROSS/BLUE SHIELD | Admitting: Family Medicine

## 2022-10-06 ENCOUNTER — Encounter: Payer: Self-pay | Admitting: Family Medicine

## 2022-10-06 DIAGNOSIS — Z113 Encounter for screening for infections with a predominantly sexual mode of transmission: Secondary | ICD-10-CM

## 2022-10-06 LAB — WET PREP FOR TRICH, YEAST, CLUE
Trichomonas Exam: NEGATIVE
Yeast Exam: NEGATIVE

## 2022-10-06 LAB — HM HIV SCREENING LAB: HM HIV Screening: NEGATIVE

## 2022-10-06 NOTE — Progress Notes (Signed)
[facility] Department  STI clinic/screening visit [address] [place] [address] [phone]  Subjective:  Linda Sawyer is a 32 y.o. female being seen today for an STI screening visit. The patient reports they do not have symptoms.  Patient reports that they do not desire a pregnancy in the next year.   They reported they are not interested in discussing contraception today.    Patient's last menstrual period was 09/03/2022 (exact date).  Patient has the following medical conditions:   Patient Active Problem List   Diagnosis Date Noted   Vaping nicotine dependence, non-tobacco product 02/12/2020   Marijuana abuse daily 02/12/2020   Acetaminophen overdose 06/27/2015   Obesity, unspecified 343 lbs 06/08/2014   Depressive disorder, major recur, unspecified (HCC) 07/27/2006    Chief Complaint  Patient presents with   SEXUALLY TRANSMITTED DISEASE    Routine screening    HPI  Patient reports to clinic for STI testing-states she has 2 little "bumps" on the "inside of my vagina lips".   Does the patient using douching products? No  Last HIV test per patient/review of record was  Lab Results  Component Value Date   HMHIVSCREEN Negative - Validated 10/21/2020    Lab Results  Component Value Date   HIV NON REACTIVE 08/31/2016   Patient reports last pap was No results found for: "DIAGPAP"  Lab Results  Component Value Date   SPECADGYN Comment 07/29/2021    Screening for MPX risk: Does the patient have an unexplained rash? No Is the patient MSM? No Does the patient endorse multiple sex partners or anonymous sex partners? No Did the patient have close or sexual contact with a person diagnosed with MPX? No Has the patient traveled outside the [place] where MPX is endemic? No Is there a high clinical suspicion for MPX-- evidenced by one of the following No  -Unlikely to be chickenpox  -Lymphadenopathy  -Rash that present in same phase of evolution on  any given body part See flowsheet for further details and programmatic requirements.   Immunization history:  Immunization History  Administered Date(s) Administered   Influenza,inj,Quad PF,6+ Mos 06/29/2015   Tdap 11/01/2018     The following portions of the patient's history were reviewed and updated as appropriate: allergies, current medications, past medical history, past social history, past surgical history and problem list.  Objective:  There were no vitals filed for this visit.  Physical Exam Vitals and nursing note reviewed.  Constitutional:      Appearance: Normal appearance.  HENT:     Head: Normocephalic and atraumatic.     Mouth/Throat:     Mouth: Mucous membranes are moist.     Pharynx: Oropharynx is clear. No oropharyngeal exudate or posterior oropharyngeal erythema.  Pulmonary:     Effort: Pulmonary effort is normal.  Abdominal:     General: Abdomen is flat.     Palpations: There is no mass.     Tenderness: There is no abdominal tenderness. There is no rebound.  Genitourinary:    General: Normal vulva.     Exam position: Lithotomy position.     Pubic Area: No rash or pubic lice.      Labia:        Right: No rash or lesion.        Left: No rash or lesion.      Vagina: Normal. No vaginal discharge, erythema, bleeding or lesions.     Cervix: Lesion present. No cervical motion tenderness, discharge,  friability or erythema.     Uterus: Normal.      Adnexa: Right adnexa normal and left adnexa normal.     Rectum: Normal.     Comments: pH = 4  Nabothian cysts- all over cervix Lymphadenopathy:     Head:     Right side of head: No preauricular or posterior auricular adenopathy.     Left side of head: No preauricular or posterior auricular adenopathy.     Cervical: No cervical adenopathy.     Upper Body:     Right upper body: No supraclavicular, axillary or epitrochlear adenopathy.     Left upper body: No supraclavicular, axillary or epitrochlear adenopathy.      Lower Body: No right inguinal adenopathy. No left inguinal adenopathy.  Skin:    General: Skin is warm and dry.     Findings: No rash.  Neurological:     Mental Status: She is alert and oriented to person, place, and time.     Assessment and Plan:  Kaula TAYLA PANOZZO is a 32 y.o. female presenting to the [facility] Department for STI screening. Not seeing anyone for pap smears. Encouraged to make a well woman appt   1. Screening for venereal disease  - WET PREP FOR TRICH, YEAST, CLUE - Chlamydia/Gonorrhea [place] [facility] - HIV [place] [facility] - Syphilis Serology, [place] [facility]   Patient accepted all screenings including vaginal CT/GC and bloodwork for HIV/RPR, and wet prep. Patient meets criteria for HepB screening? No. Ordered? not applicable Patient meets criteria for HepC screening? No. Ordered? not applicable  Treat wet prep per standing order Discussed time line for State Lab results and that patient will be called with positive results and encouraged patient to call if she had not heard in 2 weeks.  Counseled to return or seek care for continued or worsening symptoms Recommended repeat testing in 3 months with positive results. Recommended condom use with all sex  Patient is currently using  nothing  to prevent pregnancy.    No follow-ups on file.  No future appointments. Total time spent 20 mintes Lenice Llamas, [place]

## 2022-10-06 NOTE — Progress Notes (Signed)
Pt appointment for STI screening. Seen by FNP Sydnee Levans. Wet mount results negative and reviewed with pt.

## 2022-10-27 ENCOUNTER — Emergency Department
Admission: EM | Admit: 2022-10-27 | Discharge: 2022-10-27 | Disposition: A | Discharge status: Home / Self Care | Payer: BLUE CROSS/BLUE SHIELD | Attending: Emergency Medicine | Admitting: Emergency Medicine

## 2022-10-27 ENCOUNTER — Other Ambulatory Visit: Payer: Self-pay

## 2022-10-27 ENCOUNTER — Encounter: Payer: Self-pay | Admitting: Emergency Medicine

## 2022-10-27 DIAGNOSIS — S39012A Strain of muscle, fascia and tendon of lower back, initial encounter: Secondary | ICD-10-CM | POA: Insufficient documentation

## 2022-10-27 DIAGNOSIS — X58XXXA Exposure to other specified factors, initial encounter: Secondary | ICD-10-CM | POA: Insufficient documentation

## 2022-10-27 DIAGNOSIS — S3992XA Unspecified injury of lower back, initial encounter: Secondary | ICD-10-CM | POA: Diagnosis present

## 2022-10-27 LAB — POC URINE PREG, ED: Preg Test, Ur: NEGATIVE

## 2022-10-27 MED ORDER — METHOCARBAMOL 500 MG PO TABS: ORAL_TABLET | ORAL | 0 refills | Status: DC

## 2022-10-27 MED ORDER — ETODOLAC 400 MG PO TABS: 400.0000 mg | ORAL_TABLET | Freq: Two times a day (BID) | ORAL | 0 refills | Status: DC

## 2022-10-27 MED ORDER — KETOROLAC TROMETHAMINE 30 MG/ML IJ SOLN
30.0000 mg | Freq: Once | INTRAMUSCULAR | Status: AC
  Administered 2022-10-27: 30 mg via INTRAMUSCULAR
  Filled 2022-10-27: qty 1

## 2022-10-27 MED ORDER — HYDROCODONE-ACETAMINOPHEN 5-325 MG PO TABS: 1.0000 | ORAL_TABLET | Freq: Four times a day (QID) | ORAL | 0 refills | Status: DC | PRN

## 2022-10-27 NOTE — ED Triage Notes (Signed)
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

## 2022-10-27 NOTE — Discharge Instructions (Signed)
Follow-up with your primary care provider if any continued problems or concerns.  You may also follow-up with [facility] urgent care or [place] urgent care if not improving.  Any severe worsening of your symptoms over the weekend return to the emergency department.  Prescriptions were sent to the pharmacy to begin taking with etodolac being for inflammation, hydrocodone if needed for moderate to severe pain and methocarbamol which is a muscle relaxant can be taken 1 or 2 tablets every 6 hours.  The combination of the pain medication and methocarbamol could cause drowsiness and increase your risk for injury.  Do not drive or operate machinery while taking this medication.  You may also use ice or heat to your back as needed for discomfort.

## 2022-10-27 NOTE — ED Provider Notes (Signed)
[facility] Provider Note    Event Date/Time   First MD Initiated Contact with Patient 10/27/22 0720     (approximate)   History   Back Pain   HPI  Linda Sawyer is a 32 y.o. 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  Patient denies any incontinence of bowel or bladder.      Physical Exam   Triage Vital Signs: ED Triage Vitals  Enc Vitals Group     BP 10/27/22 0652 116/81     Pulse Rate 10/27/22 0652 69     Resp 10/27/22 0652 18     Temp 10/27/22 0652 98.3 F (36.8 C)     Temp Source 10/27/22 0652 Oral     SpO2 10/27/22 0652 100 %     Weight 10/27/22 0650 (!) 353 lb (160.1 kg)     Height 10/27/22 0650 5\' 10"  (1.778 m)     Head Circumference --      Peak Flow --      Pain Score 10/27/22 0650 9     Pain Loc --      Pain Edu? --      Excl. in GC? --     Most recent vital signs: Vitals:   10/27/22 0652  BP: 116/81  Pulse: 69  Resp: 18  Temp: 98.3 F (36.8 C)  SpO2: 100%     General: Awake, no distress.  CV:  Good peripheral perfusion.  Resp:  Normal effort.  Abd:  No distention.  Other:  Moderate tenderness on palpation lower lumbar paravertebral muscles bilaterally.  Range of motion is slow and guarded secondary to increased pain.  Good muscle strength 5/5, straight leg raises are approximately 40 degrees bilaterally with discomfort.  No skin discoloration or injuries present.   ED Results / Procedures / Treatments   Labs (all labs ordered are listed, but only abnormal results are displayed) Labs Reviewed  POC URINE PREG, ED     PROCEDURES:  Critical Care performed:   Procedures   MEDICATIONS ORDERED IN ED: Medications  ketorolac (TORADOL) 30 MG/ML injection 30 mg (30 mg Intramuscular Given 10/27/22 0753)      IMPRESSION / MDM / ASSESSMENT AND PLAN / ED COURSE  I reviewed the triage vital signs and the nursing notes.   Differential diagnosis includes, but is not limited to, lumbar strain, cauda equina which is unlikely but considered, low back pain.  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  Physical exam was consistent lumbar strain.  Patient was given Toradol 30 mg IM while in the ED.  A prescription for etodolac 400 mg twice daily, hydrocodone and methocarbamol 1 or 2 tablets every 6 hours as needed for muscle spasms was prescribed.  Patient is encouraged to use ice or heat to her back as needed and to follow-up with her PCP if any continued problems.      Patient's presentation is most consistent with acute illness / injury with system symptoms.  FINAL CLINICAL IMPRESSION(S) / ED DIAGNOSES   Final diagnoses:  Acute myofascial strain of lumbar region, initial encounter     Rx /  DC Orders   ED Discharge Orders          Ordered    methocarbamol (ROBAXIN) 500 MG tablet        10/27/22 0752    etodolac (LODINE) 400 MG tablet  2 times daily        10/27/22 0752    HYDROcodone-acetaminophen (NORCO/VICODIN) 5-325 MG tablet  Every 6 hours PRN        10/27/22 1610             Note:  This document was prepared using Dragon voice recognition software and may include unintentional dictation errors.   Tommi Rumps, PA-C 10/27/22 1347    Minna Antis, MD 10/27/22 1434

## 2022-10-27 NOTE — ED Notes (Signed)
See triage note  Presents with lower back pain    Ambulates well  Denies any specific trauma  but states she may have pulled a muscle

## 2022-12-22 ENCOUNTER — Ambulatory Visit (LOCAL_COMMUNITY_HEALTH_CENTER): Payer: Self-pay

## 2022-12-22 ENCOUNTER — Other Ambulatory Visit: Payer: Self-pay

## 2022-12-22 DIAGNOSIS — Z111 Encounter for screening for respiratory tuberculosis: Secondary | ICD-10-CM

## 2022-12-25 ENCOUNTER — Ambulatory Visit (LOCAL_COMMUNITY_HEALTH_CENTER): Payer: Self-pay

## 2022-12-25 DIAGNOSIS — Z111 Encounter for screening for respiratory tuberculosis: Secondary | ICD-10-CM

## 2022-12-25 LAB — TB SKIN TEST
Induration: 0 mm
TB Skin Test: NEGATIVE

## 2023-02-05 ENCOUNTER — Ambulatory Visit: Payer: BLUE CROSS/BLUE SHIELD | Admitting: Family Medicine

## 2023-02-05 DIAGNOSIS — Z708 Other sex counseling: Secondary | ICD-10-CM

## 2023-02-05 DIAGNOSIS — Z114 Encounter for screening for human immunodeficiency virus [HIV]: Secondary | ICD-10-CM

## 2023-02-05 DIAGNOSIS — Z113 Encounter for screening for infections with a predominantly sexual mode of transmission: Secondary | ICD-10-CM

## 2023-02-05 HISTORY — DX: Encounter for screening for infections with a predominantly sexual mode of transmission: Z11.3

## 2023-02-05 LAB — HM HIV SCREENING LAB: HM HIV Screening: NEGATIVE

## 2023-02-05 NOTE — Progress Notes (Unsigned)
STI clinic/screening visit [address] [place] [address] [phone]  Subjective:  Linda Sawyer is a 32 y.o. female being seen today for [facility]. The patient reports they do not have symptoms.    Patient's last menstrual period was 01/22/2023 (exact date).  Patient has the following medical conditions:   Patient Active Problem List   Diagnosis Date Noted   Screening for venereal disease 02/05/2023   Vaping nicotine dependence, non-tobacco product 02/12/2020   Marijuana abuse daily 02/12/2020   Acetaminophen overdose 06/27/2015   Obesity, unspecified 343 lbs 06/08/2014   Depressive disorder, major recur, unspecified (HCC) 07/27/2006    Chief Complaint  Patient presents with   SEXUALLY TRANSMITTED DISEASE    STI screening-no symptoms    Does the patient using douching products? No  Last HIV test per patient/review of record was  Lab Results  Component Value Date   HMHIVSCREEN Negative - Validated 10/06/2022    Lab Results  Component Value Date   HIV NON REACTIVE 08/31/2016   Patient reports last pap was No results found for: "DIAGPAP"  Lab Results  Component Value Date   SPECADGYN Comment 07/29/2021    Screening for MPX risk: Does the patient have an unexplained rash? No Is the patient MSM? No Does the patient endorse multiple sex partners or anonymous sex partners? No Did the patient have close or sexual contact with a person diagnosed with MPX? No Has the patient traveled outside the [place] where MPX is endemic? No Is there a high clinical suspicion for MPX-- evidenced by one of the following No  -Unlikely to be chickenpox  -Lymphadenopathy  -Rash that present in same phase of evolution on any given body part See flowsheet for further details and programmatic requirements.   Immunization history:  Immunization History  Administered Date(s) Administered   Influenza,inj,Quad PF,6+ Mos 06/29/2015   PPD Test 12/22/2022   Tdap  11/01/2018     The following portions of the patient's history were reviewed and updated as appropriate: allergies, current medications, past medical history, past social history, past surgical history and problem list.  Objective:  There were no vitals filed for this visit.  Patient seen by RN only. Self collected swabs.    Assessment and Plan:  Linda Sawyer is a 32 y.o. female presenting to the [facility] Department for STI screening in [facility]  1. Screening for venereal disease  Yes - Chlamydia/Gonorrhea [place] [facility] - HIV [place] [facility] - Syphilis Serology, [place] [facility] - Chlamydia/Gonorrhea [place] [facility]  2. Counseling on sexually transmitted disease  Yes  3. Screening for human immunodeficiency virus  Yes   Patient accepted all screenings including oral, vaginal CT/GC and bloodwork for HIV/RPR. Patient meets criteria for HepB screening? No. Ordered? not applicable Patient meets criteria for HepC screening? No. Ordered? not applicable  Treat positive results per standing order.  Discussed time line for State Lab results and that patient will be called with positive results and encouraged patient to call if she had not heard in 2 weeks.  Recommended repeat testing in 3 months with positive results. Recommended condom use with all sex  Patient is currently using  seeking pregnancy  to prevent pregnancy.    No follow-ups on file.  No future appointments.  Collins Scotland, RN

## 2023-02-07 NOTE — Progress Notes (Signed)
Evaluation and management procedures were performed by the RN under standing order. I have reviewed the RN's note and chart, and I agree with the management and plan. I have also made any necessary editorial changes.  Fayette Pho, MD

## 2023-02-14 ENCOUNTER — Other Ambulatory Visit: Payer: Self-pay

## 2023-02-14 ENCOUNTER — Emergency Department
Admission: EM | Admit: 2023-02-14 | Discharge: 2023-02-14 | Disposition: A | Discharge status: Home / Self Care | Payer: BLUE CROSS/BLUE SHIELD | Attending: Emergency Medicine | Admitting: Emergency Medicine

## 2023-02-14 DIAGNOSIS — I10 Essential (primary) hypertension: Secondary | ICD-10-CM | POA: Diagnosis not present

## 2023-02-14 DIAGNOSIS — S060X0A Concussion without loss of consciousness, initial encounter: Secondary | ICD-10-CM | POA: Diagnosis not present

## 2023-02-14 DIAGNOSIS — Y92410 Unspecified street and highway as the place of occurrence of the external cause: Secondary | ICD-10-CM | POA: Diagnosis not present

## 2023-02-14 DIAGNOSIS — S0990XA Unspecified injury of head, initial encounter: Secondary | ICD-10-CM | POA: Diagnosis present

## 2023-02-14 NOTE — ED Notes (Signed)
AAOx3.  Skin warm and dry.  Ambulates with easy and steady gait. NAD 
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
## 2023-02-14 NOTE — Discharge Instructions (Signed)
I believe you have a concussion.  You likely be symptomatic for a week.  If you continue to have symptoms past 2 weeks please be seen by medical provider.  You can take Tylenol and ibuprofen as needed for pain.  I have attached information about concussions and how to return to activity.
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
## 2023-04-11 ENCOUNTER — Emergency Department
Admission: EM | Admit: 2023-04-11 | Discharge: 2023-04-11 | Disposition: A | Discharge status: Home / Self Care | Payer: BLUE CROSS/BLUE SHIELD | Attending: Emergency Medicine | Admitting: Emergency Medicine

## 2023-04-11 ENCOUNTER — Emergency Department: Payer: BLUE CROSS/BLUE SHIELD

## 2023-04-11 ENCOUNTER — Other Ambulatory Visit: Payer: Self-pay

## 2023-04-11 ENCOUNTER — Encounter: Payer: Self-pay | Admitting: Emergency Medicine

## 2023-04-11 DIAGNOSIS — R209 Unspecified disturbances of skin sensation: Secondary | ICD-10-CM | POA: Insufficient documentation

## 2023-04-11 DIAGNOSIS — R202 Paresthesia of skin: Secondary | ICD-10-CM

## 2023-04-11 DIAGNOSIS — G44311 Acute post-traumatic headache, intractable: Secondary | ICD-10-CM | POA: Diagnosis not present

## 2023-04-11 DIAGNOSIS — M79605 Pain in left leg: Secondary | ICD-10-CM | POA: Insufficient documentation

## 2023-04-11 DIAGNOSIS — R519 Headache, unspecified: Secondary | ICD-10-CM

## 2023-04-11 LAB — CBC
HCT: 35.3 % — ABNORMAL LOW (ref 36.0–46.0)
Hemoglobin: 11 g/dL — ABNORMAL LOW (ref 12.0–15.0)
MCH: 21 pg — ABNORMAL LOW (ref 26.0–34.0)
MCHC: 31.2 g/dL (ref 30.0–36.0)
MCV: 67.4 fL — ABNORMAL LOW (ref 80.0–100.0)
Platelets: 330 10*3/uL (ref 150–400)
RBC: 5.24 MIL/uL — ABNORMAL HIGH (ref 3.87–5.11)
RDW: 17 % — ABNORMAL HIGH (ref 11.5–15.5)
WBC: 6.5 10*3/uL (ref 4.0–10.5)
nRBC: 0 % (ref 0.0–0.2)

## 2023-04-11 LAB — BASIC METABOLIC PANEL
Anion gap: 6 (ref 5–15)
BUN: 8 mg/dL (ref 6–20)
CO2: 26 mmol/L (ref 22–32)
Calcium: 8.6 mg/dL — ABNORMAL LOW (ref 8.9–10.3)
Chloride: 103 mmol/L (ref 98–111)
Creatinine, Ser: 0.79 mg/dL (ref 0.44–1.00)
GFR, Estimated: >60 mL/min (ref >60)
Glucose, Bld: 104 mg/dL — ABNORMAL HIGH (ref 70–99)
Potassium: 3.5 mmol/L (ref 3.5–5.1)
Sodium: 135 mmol/L (ref 135–145)

## 2023-04-11 LAB — HEPATIC FUNCTION PANEL
ALT: 15 U/L (ref 0–44)
AST: 18 U/L (ref 15–41)
Albumin: 3.8 g/dL (ref 3.5–5.0)
Alkaline Phosphatase: 67 U/L (ref 38–126)
Bilirubin, Direct: <0.1 mg/dL (ref 0.0–0.2)
Total Bilirubin: 0.6 mg/dL (ref 0.3–1.2)
Total Protein: 8.4 g/dL — ABNORMAL HIGH (ref 6.5–8.1)

## 2023-04-11 LAB — MAGNESIUM: Magnesium: 2.2 mg/dL (ref 1.7–2.4)

## 2023-04-11 LAB — POC URINE PREG, ED: Preg Test, Ur: NEGATIVE

## 2023-04-11 MED ORDER — PROCHLORPERAZINE EDISYLATE 10 MG/2ML IJ SOLN
10.0000 mg | Freq: Once | INTRAMUSCULAR | Status: AC
  Administered 2023-04-11: 10 mg via INTRAVENOUS
  Filled 2023-04-11: qty 2

## 2023-04-11 MED ORDER — ACETAMINOPHEN 500 MG PO TABS
1000.0000 mg | ORAL_TABLET | Freq: Once | ORAL | Status: AC
  Administered 2023-04-11: 1000 mg via ORAL
  Filled 2023-04-11: qty 2

## 2023-04-11 MED ORDER — KETOROLAC TROMETHAMINE 15 MG/ML IJ SOLN
15.0000 mg | Freq: Once | INTRAMUSCULAR | Status: AC
  Administered 2023-04-11: 15 mg via INTRAVENOUS
  Filled 2023-04-11: qty 1

## 2023-04-11 MED ORDER — DIPHENHYDRAMINE HCL 50 MG/ML IJ SOLN
25.0000 mg | Freq: Once | INTRAMUSCULAR | Status: AC
  Administered 2023-04-11: 25 mg via INTRAVENOUS
  Filled 2023-04-11: qty 1

## 2023-04-11 NOTE — ED Provider Notes (Signed)
[facility] Provider Note    Event Date/Time   First MD Initiated Contact with Patient 04/11/23 1550     (approximate)   History   Numbness   HPI  Linda Sawyer is a 32 y.o. female who comes in with left leg pain since last night.  Patient reports that she had some pain in her left leg but no numbness or tingling until this morning.  She went to bed at 2 AM and had some pain but then woke up with some tingling from the knee down.  She also reports some tingling in her left hand.  She denies any other tingling or numbness.  No weakness noted just more of pain.  She does report having a history of migraines and having a headache right now she states that this headache is similar or actually even less than her normal headaches.  She does report a lot of stress.  Denies any chest pain, shortness of breath, bradycardia when she urinates.     Physical Exam   Triage Vital Signs: ED Triage Vitals  Encounter Vitals Group     BP 04/11/23 1501 (!) 132/97     Systolic BP Percentile --      Diastolic BP Percentile --      Pulse Rate 04/11/23 1501 74     Resp 04/11/23 1501 20     Temp 04/11/23 1501 98.3 F (36.8 C)     Temp Source 04/11/23 1501 Oral     SpO2 04/11/23 1501 98 %     Weight --      Height 04/11/23 1500 5\' 10"  (1.778 m)     Head Circumference --      Peak Flow --      Pain Score 04/11/23 1500 7     Pain Loc --      Pain Education --      Exclude from Growth Chart --     Most recent vital signs: Vitals:   04/11/23 1501  BP: (!) 132/97  Pulse: 74  Resp: 20  Temp: 98.3 F (36.8 C)  SpO2: 98%     General: Awake, no distress.  CV:  Good peripheral perfusion.  Resp:  Normal effort.  Abd:  No distention.  Other:  Patient is suppurative tingling of her left hand but equal grip strength and no pronator drift.  She also reports some pain and tingling of the left leg from the knee down.  Good distal pulse no obvious swelling noted.  Cranial  nerves are otherwise intact.   ED Results / Procedures / Treatments   Labs (all labs ordered are listed, but only abnormal results are displayed) Labs Reviewed  CBC - Abnormal; Notable for the following components:      Result Value   RBC 5.24 (*)    Hemoglobin 11.0 (*)    HCT 35.3 (*)    MCV 67.4 (*)    MCH 21.0 (*)    RDW 17.0 (*)    All other components within normal limits  BASIC METABOLIC PANEL - Abnormal; Notable for the following components:   Glucose, Bld 104 (*)    Calcium 8.6 (*)    All other components within normal limits      RADIOLOGY I have reviewed the CT personally interpreted no evidence of intercranial hemorrhage  PROCEDURES:  Critical Care performed: No  Procedures   MEDICATIONS ORDERED IN ED: Medications - No data to display   IMPRESSION / MDM /  ASSESSMENT AND PLAN / ED COURSE  I reviewed the triage vital signs and the nursing notes.   Patient's presentation is most consistent with acute presentation with potential threat to life or bodily function.   Patient comes in with a left leg pain and tingling.  Suspect most likely peripheral she is good good distal pulse ultrasound ordered evaluate for DVT because she does report pain in her calf.  For the left hand tingling I suspect again more likely peripheral in nature.  She does report some headaches that this could be a complex migraine.  She reports she has had headaches previously and this seems to be at its baseline self does not seem to be sudden or severe in onset to suggest subarachnoid.  We discussed MRI to rule out stroke and patient is opted to decline at this time.  IMPRESSION: 1. No evidence of acute intracranial abnormality. 2. Partially empty sella, which is often a normal anatomic variant but can be associated with idiopathic intracranial hypertension.  Bmp normal Cbc normal   6:06 PM reevaluated patient and she reports resolution of symptoms.  She is requesting to have her IV  taken out.  She declines any type of MRI and I have low suspicion for stroke at this time.  We discussed the empty sella and need to follow-up with ophthalmology for eye exam to look for papilledema but at this time she denies any blurred vision.  We have also discussed following up with neurology about lumbar puncture.  We discussed lumbar puncture here in the ER but given resolution of symptoms and no blurred vision she is opted to want to follow-up outpatient for this.  She understands that she return to the ER for worsening symptoms or any other concerns.  The patient is on the cardiac monitor to evaluate for evidence of arrhythmia and/or significant heart rate changes.      FINAL CLINICAL IMPRESSION(S) / ED DIAGNOSES   Final diagnoses:  Tingling  Acute intractable headache, unspecified headache type     Rx / DC Orders   ED Discharge Orders     None        Note:  This document was prepared using Dragon voice recognition software and may include unintentional dictation errors.   Concha Se, MD 04/11/23 [phone]

## 2023-04-11 NOTE — ED Triage Notes (Signed)
Pt via POV from home. Pt c/o L leg pain and numbness that started yesterday, reports she woke up this morning the L arm numbness, pt reports as a pins and needles type of pain. Pt reports headache, but states she has a hx of same. Pt is A&Ox4 and NAD

## 2023-04-11 NOTE — Discharge Instructions (Addendum)
Call the eye clinic to make a follow-up appointment state that you need an ER follow-up to have your eyes evaluated for papilledema.  You should also schedule a follow-up with neurology for your headaches to discuss your CT imaging and discuss further workup and may need outpatinet lumbar puncture.  Return to the ER if develop worsening symptoms fevers or any other concerns  IMPRESSION: 1. No evidence of acute intracranial abnormality. 2. Partially empty sella, which is often a normal anatomic variant but can be associated with idiopathic intracranial hypertension.

## 2023-04-11 NOTE — ED Notes (Signed)
First nurse note: Pt has had L leg sharp pain and numbness since last night. Went to sleep, woke up at 12pm with L arm also feeling numb, which has been spreading down arm to hand since then. Pt has no arm drift, denies HA or vision problems, speech clear.

## 2023-04-11 NOTE — ED Notes (Signed)
Pt A&O x4, no obvious distress noted, respirations regular/unlabored. Pt verbalizes understanding of discharge instructions. Pt able to ambulate from ED independently.   

## 2023-07-17 ENCOUNTER — Ambulatory Visit: Payer: BLUE CROSS/BLUE SHIELD

## 2023-07-17 DIAGNOSIS — Z113 Encounter for screening for infections with a predominantly sexual mode of transmission: Secondary | ICD-10-CM

## 2023-07-17 LAB — HM HIV SCREENING LAB: HM HIV Screening: NEGATIVE

## 2023-07-17 LAB — WET PREP FOR TRICH, YEAST, CLUE
Trichomonas Exam: NEGATIVE
Yeast Exam: NEGATIVE

## 2023-07-17 NOTE — Progress Notes (Signed)
 [facility] Department STI clinic [address] [place] [address] Main phone: [phone]  STI screening visit  Subjective:  Linda Sawyer is a 33 y.o. female being seen today for an STI screening visit. The patient reports they do have symptoms.  Patient reports that they do not desire a pregnancy in the next year.   They reported they are not interested in discussing contraception today.    Patient's last menstrual period was 07/09/2023 (exact date).  Patient has the following medical conditions:  Patient Active Problem List   Diagnosis Date Noted   Vaping nicotine dependence, non-tobacco product 02/12/2020   Marijuana abuse daily 02/12/2020   Acetaminophen  overdose 06/27/2015   Obesity, unspecified 343 lbs 06/08/2014   Depressive disorder, major recur, unspecified (HCC) 07/27/2006    Chief Complaint  Patient presents with   SEXUALLY TRANSMITTED DISEASE    STI screening-discomfort in vaginal area    HPI HPI Patient reports to clinic for STI testing- states she has discomfort with vaginal itching x 1 week  Does the patient using douching products? No  Last HIV test per patient/review of record was  Lab Results  Component Value Date   HMHIVSCREEN Negative - Validated 02/05/2023    Lab Results  Component Value Date   HIV NON REACTIVE 08/31/2016     Last HEPC test per patient/review of record was  Lab Results  Component Value Date   HMHEPCSCREEN Negative-Validated 10/21/2020   No components found for: HEPC   Last HEPB test per patient/review of record was No components found for: HMHEPBSCREEN   Patient reports last pap was:   No results found for: DIAGPAP, HPVHIGH, ADEQPAP Lab Results  Component Value Date   SPECADGYN Comment 07/29/2021   Result Date Procedure Results Follow-ups  07/29/2021 IGP, Aptima HPV DIAGNOSIS:: Comment Specimen adequacy:: Comment Clinician Provided ICD10: Comment Performed by::  Comment PAP Smear Comment: . Note:: Comment Test Methodology: Comment HPV Aptima: Negative   07/10/2017 HM PAP SMEAR HM Pap smear: Negative     Screening for MPX risk: Does the patient have an unexplained rash? No Is the patient MSM? No Does the patient endorse multiple sex partners or anonymous sex partners? No Did the patient have close or sexual contact with a person diagnosed with MPX? No Has the patient traveled outside the [place]  where MPX is endemic? No Is there a high clinical suspicion for MPX-- evidenced by one of the following No  -Unlikely to be chickenpox  -Lymphadenopathy  -Rash that present in same phase of evolution on any given body part See flowsheet for further details and programmatic requirements.   Immunization history:  Immunization History  Administered Date(s) Administered   Influenza,inj,Quad PF,6+ Mos 06/29/2015   PPD Test 12/22/2022   Tdap 11/01/2018     The following portions of the patient's history were reviewed and updated as appropriate: allergies, current medications, past medical history, past social history, past surgical history and problem list.  Objective:  There were no vitals filed for this visit.  Physical Exam Vitals and nursing note reviewed. Exam conducted with a chaperone present Anner Edison RN).  Constitutional:      Appearance: She is obese.  HENT:     Head: Normocephalic and atraumatic.     Mouth/Throat:     Mouth: Mucous membranes are moist.     Pharynx: Oropharynx is clear. No oropharyngeal exudate or posterior oropharyngeal erythema.  Pulmonary:     Effort: Pulmonary effort is normal.  Abdominal:  General: Abdomen is flat.     Palpations: There is no mass.     Tenderness: There is no abdominal tenderness. There is no rebound.  Genitourinary:    General: Normal vulva.     Exam position: Lithotomy position.     Pubic Area: No rash or pubic lice.      Labia:        Right: No rash or lesion.        Left: No rash or  lesion.      Vagina: Vaginal discharge present. No erythema, bleeding or lesions.     Cervix: No cervical motion tenderness, discharge, friability, lesion or erythema.     Uterus: Normal.      Adnexa: Right adnexa normal and left adnexa normal.     Rectum: Normal.     Comments: pH = 4  Small amt of white vaginal discharge present Lymphadenopathy:     Head:     Right side of head: No preauricular or posterior auricular adenopathy.     Left side of head: No preauricular or posterior auricular adenopathy.     Cervical: No cervical adenopathy.     Upper Body:     Right upper body: No supraclavicular, axillary or epitrochlear adenopathy.     Left upper body: No supraclavicular, axillary or epitrochlear adenopathy.     Lower Body: No right inguinal adenopathy. No left inguinal adenopathy.  Skin:    General: Skin is warm and dry.     Findings: No rash.  Neurological:     Mental Status: She is alert and oriented to person, place, and time.     Assessment and Plan:  Linda Sawyer is a 33 y.o. female presenting to the [facility] Department for STI screening  1. Screening for venereal disease (Primary)  - Chlamydia/Gonorrhea [facility] - HIV [place] - Syphilis Serology, [facility] - WET PREP FOR TRICH, YEAST, CLUE   Patient accepted all screenings including  vaginal CT/GC and bloodwork for HIV/RPR, and wet prep. Patient meets criteria for HepB screening? No. Ordered? not applicable Patient meets criteria for HepC screening? No. Ordered? not applicable  Treat wet prep per standing order Discussed time line for [facility] results and that patient will be called with positive results and encouraged patient to call if she had not heard in 2 weeks.  Counseled to return or seek care for continued or worsening symptoms Recommended repeat testing in 3 months with positive results. Recommended condom use with all sex for STI prevention.   Patient is currently using   nothing  to prevent pregnancy.    Return if symptoms worsen or fail to improve, for STI screening.  No future appointments.  Verneta Bers, [place]

## 2023-07-17 NOTE — Progress Notes (Signed)
 Pt here for STI screening.  No symptoms other than vaginal discomfort.  Wet mount results reviewed with patient.  No treatment needed at this time.  Condoms declined.  Pt to call if any further questions or concerns.  Verbalizes Kipp Pry, RN

## 2023-09-27 ENCOUNTER — Encounter: Payer: Self-pay | Admitting: Emergency Medicine

## 2023-09-27 ENCOUNTER — Emergency Department
Admission: EM | Admit: 2023-09-27 | Discharge: 2023-09-27 | Disposition: A | Discharge status: Home / Self Care | Payer: Self-pay | Attending: Emergency Medicine | Admitting: Emergency Medicine

## 2023-09-27 ENCOUNTER — Emergency Department: Payer: Self-pay

## 2023-09-27 ENCOUNTER — Other Ambulatory Visit: Payer: Self-pay

## 2023-09-27 DIAGNOSIS — N939 Abnormal uterine and vaginal bleeding, unspecified: Secondary | ICD-10-CM | POA: Insufficient documentation

## 2023-09-27 LAB — CBC WITH DIFFERENTIAL/PLATELET
Abs Immature Granulocytes: 0.01 10*3/uL (ref 0.00–0.07)
Basophils Absolute: 0 10*3/uL (ref 0.0–0.1)
Basophils Relative: 1 %
Eosinophils Absolute: 0.2 10*3/uL (ref 0.0–0.5)
Eosinophils Relative: 3 %
HCT: 34.7 % — ABNORMAL LOW (ref 36.0–46.0)
Hemoglobin: 10.6 g/dL — ABNORMAL LOW (ref 12.0–15.0)
Immature Granulocytes: 0 %
Lymphocytes Relative: 38 %
Lymphs Abs: 2.5 10*3/uL (ref 0.7–4.0)
MCH: 21.1 pg — ABNORMAL LOW (ref 26.0–34.0)
MCHC: 30.5 g/dL (ref 30.0–36.0)
MCV: 69 fL — ABNORMAL LOW (ref 80.0–100.0)
Monocytes Absolute: 0.7 10*3/uL (ref 0.1–1.0)
Monocytes Relative: 10 %
Neutro Abs: 3.3 10*3/uL (ref 1.7–7.7)
Neutrophils Relative %: 48 %
Platelets: 266 10*3/uL (ref 150–400)
RBC: 5.03 MIL/uL (ref 3.87–5.11)
RDW: 18.3 % — ABNORMAL HIGH (ref 11.5–15.5)
WBC: 6.7 10*3/uL (ref 4.0–10.5)
nRBC: 0 % (ref 0.0–0.2)

## 2023-09-27 LAB — URINALYSIS, ROUTINE W REFLEX MICROSCOPIC
Bilirubin Urine: NEGATIVE
Glucose, UA: NEGATIVE mg/dL
Ketones, ur: NEGATIVE mg/dL
Leukocytes,Ua: NEGATIVE
Nitrite: NEGATIVE
Protein, ur: 30 mg/dL — AB
Specific Gravity, Urine: 1.021 (ref 1.005–1.030)
pH: 6 (ref 5.0–8.0)

## 2023-09-27 LAB — BASIC METABOLIC PANEL WITH GFR
Anion gap: 6 (ref 5–15)
BUN: 7 mg/dL (ref 6–20)
CO2: 27 mmol/L (ref 22–32)
Calcium: 8.6 mg/dL — ABNORMAL LOW (ref 8.9–10.3)
Chloride: 103 mmol/L (ref 98–111)
Creatinine, Ser: 0.79 mg/dL (ref 0.44–1.00)
GFR, Estimated: >60 mL/min (ref >60)
Glucose, Bld: 105 mg/dL — ABNORMAL HIGH (ref 70–99)
Potassium: 3.6 mmol/L (ref 3.5–5.1)
Sodium: 136 mmol/L (ref 135–145)

## 2023-09-27 LAB — POC URINE PREG, ED: Preg Test, Ur: NEGATIVE

## 2023-09-27 MED ORDER — MEDROXYPROGESTERONE ACETATE 5 MG PO TABS
5.0000 mg | ORAL_TABLET | Freq: Every day | ORAL | 0 refills | Status: AC
Start: 2023-09-27 — End: 2024-09-26

## 2023-09-27 NOTE — ED Triage Notes (Signed)
 Pt c/o menstrual cycle that has been ongoing for 33 days. Pt does not follow with an OBGYN. Pt says this has happened one other time when she was a teenager and she came here and was given birth control. Pt c/o fatigue.

## 2023-09-27 NOTE — ED Provider Notes (Signed)
 [facility] Provider Note    Event Date/Time   First MD Initiated Contact with Patient 09/27/23 [phone]     (approximate)   History   Vaginal Bleeding   HPI  Linda Sawyer is a 33 y.o. female with history of heart murmur presents emergency department stating she has had her menstrual cycle for 33 days.  States going through 3-4 pads per day.  States yesterday she messed up her close it was so heavy.  States this happened before in 2019.  Has not had this problem since then.  Does not have an OB/GYN.  Patient's never been pregnant before.  Unsure if she could have been pregnant prior to starting the bleeding.  Denies fever, chills.  Patient states she is just very tired and fatigued      Physical Exam   Triage Vital Signs: ED Triage Vitals  Encounter Vitals Group     BP 09/27/23 0858 (!) 146/100     Systolic BP Percentile --      Diastolic BP Percentile --      Pulse Rate 09/27/23 0857 68     Resp 09/27/23 0857 19     Temp 09/27/23 0857 97.7 F (36.5 C)     Temp Source 09/27/23 0857 Oral     SpO2 09/27/23 0857 100 %     Weight 09/27/23 0928 (!) 348 lb 5.2 oz (158 kg)     Height 09/27/23 0857 5\' 11"  (1.803 m)     Head Circumference --      Peak Flow --      Pain Score 09/27/23 0857 0     Pain Loc --      Pain Education --      Exclude from Growth Chart --     Most recent vital signs: Vitals:   09/27/23 0857 09/27/23 0858  BP:  (!) 146/100  Pulse: 68   Resp: 19   Temp: 97.7 F (36.5 C)   SpO2: 100%      General: Awake, no distress.   CV:  Good peripheral perfusion. regular rate and  rhythm Resp:  Normal effort.  Abd:  No distention.  Nontender Other:     ED Results / Procedures / Treatments   Labs (all labs ordered are listed, but only abnormal results are displayed) Labs Reviewed  CBC WITH DIFFERENTIAL/PLATELET - Abnormal; Notable for the following components:      Result Value   Hemoglobin 10.6 (*)    HCT 34.7 (*)    MCV  69.0 (*)    MCH 21.1 (*)    RDW 18.3 (*)    All other components within normal limits  BASIC METABOLIC PANEL WITH GFR - Abnormal; Notable for the following components:   Glucose, Bld 105 (*)    Calcium 8.6 (*)    All other components within normal limits  URINALYSIS, ROUTINE W REFLEX MICROSCOPIC - Abnormal; Notable for the following components:   Color, Urine YELLOW (*)    APPearance HAZY (*)    Hgb urine dipstick LARGE (*)    Protein, ur 30 (*)    Bacteria, UA MANY (*)    All other components within normal limits  POC URINE PREG, ED     EKG     RADIOLOGY Ultrasound pelvis    PROCEDURES:   Procedures Chief Complaint  Patient presents with   Vaginal Bleeding      MEDICATIONS ORDERED IN ED: Medications - No data to display  IMPRESSION / MDM / ASSESSMENT AND PLAN / ED COURSE  I reviewed the triage vital signs and the nursing notes.                              Differential diagnosis includes, but is not limited to, menorrhagia, dysfunctional uterine bleeding, abnormal uterine bleeding, retained products of pregnancy, perimenopausal, anemia  Patient's presentation is most consistent with acute illness / injury with system symptoms.   Patient's hemoglobin is very similar to her past hemoglobins final read reviewed her past medical chart.  It is at 10.6.  Patient's not been much over 11, and has several readings of 10.8.  Therefore I do not feel that the patient will need a blood transfusion or iron transfusion  Basic metabolic panel reassuring, POC pregnancy is negative  Ultrasound of the pelvis complete to assess for fibroids and heavy uterine bleeding  Ultrasound report has not been called the patient needed to go.  I did discuss her leaving without the ultrasound report.  She agrees that if it is something serious she will return emergency department immediately.  I will wait and call her with the results.  However did go ahead and place her on Provera.  She  is given a work note.  Discharged stable condition.   Did make phone call to the patient.  Explained ultrasound results to her.  Radiologist to confirm there is nothing abnormal but if the bleeding continues after hormone therapy she should follow-up with GYN for biopsy.  I did explain this to the patient.  She had been given [facility] as a follow-up.  She is to call them for an appointment.  She is in agreement treatment plan.  FINAL CLINICAL IMPRESSION(S) / ED DIAGNOSES   Final diagnoses:  Vaginal bleeding     Rx / DC Orders   ED Discharge Orders          Ordered    medroxyPROGESTERone (PROVERA) 5 MG tablet  Daily        09/27/23 1226             Note:  This document was prepared using Dragon voice recognition software and may include unintentional dictation errors.    Delsie Figures, PA-C 09/27/23 1401    Claria Crofts, MD 09/27/23 1520

## 2023-09-30 ENCOUNTER — Emergency Department
Admission: EM | Admit: 2023-09-30 | Discharge: 2023-09-30 | Disposition: A | Discharge status: Home / Self Care | Payer: Self-pay | Attending: Emergency Medicine | Admitting: Emergency Medicine

## 2023-09-30 ENCOUNTER — Emergency Department
Admission: EM | Admit: 2023-09-30 | Discharge: 2023-10-01 | Disposition: A | Discharge status: Home / Self Care | Payer: Self-pay | Attending: Emergency Medicine | Admitting: Emergency Medicine

## 2023-09-30 ENCOUNTER — Other Ambulatory Visit: Payer: Self-pay

## 2023-09-30 ENCOUNTER — Encounter: Payer: Self-pay | Admitting: Emergency Medicine

## 2023-09-30 DIAGNOSIS — J020 Streptococcal pharyngitis: Secondary | ICD-10-CM | POA: Insufficient documentation

## 2023-09-30 DIAGNOSIS — D72829 Elevated white blood cell count, unspecified: Secondary | ICD-10-CM | POA: Insufficient documentation

## 2023-09-30 DIAGNOSIS — J039 Acute tonsillitis, unspecified: Secondary | ICD-10-CM | POA: Insufficient documentation

## 2023-09-30 LAB — CBC WITH DIFFERENTIAL/PLATELET
Abs Immature Granulocytes: 0.05 10*3/uL (ref 0.00–0.07)
Basophils Absolute: 0 10*3/uL (ref 0.0–0.1)
Basophils Relative: 0 %
Eosinophils Absolute: 0 10*3/uL (ref 0.0–0.5)
Eosinophils Relative: 0 %
HCT: 34.3 % — ABNORMAL LOW (ref 36.0–46.0)
Hemoglobin: 10.6 g/dL — ABNORMAL LOW (ref 12.0–15.0)
Immature Granulocytes: 0 %
Lymphocytes Relative: 11 %
Lymphs Abs: 1.5 10*3/uL (ref 0.7–4.0)
MCH: 21 pg — ABNORMAL LOW (ref 26.0–34.0)
MCHC: 30.9 g/dL (ref 30.0–36.0)
MCV: 67.9 fL — ABNORMAL LOW (ref 80.0–100.0)
Monocytes Absolute: 1.3 10*3/uL — ABNORMAL HIGH (ref 0.1–1.0)
Monocytes Relative: 10 %
Neutro Abs: 11.1 10*3/uL — ABNORMAL HIGH (ref 1.7–7.7)
Neutrophils Relative %: 79 %
Platelets: 250 10*3/uL (ref 150–400)
RBC: 5.05 MIL/uL (ref 3.87–5.11)
RDW: 18 % — ABNORMAL HIGH (ref 11.5–15.5)
WBC: 14 10*3/uL — ABNORMAL HIGH (ref 4.0–10.5)
nRBC: 0 % (ref 0.0–0.2)

## 2023-09-30 LAB — RESP PANEL BY RT-PCR (RSV, FLU A&B, COVID)  RVPGX2
Influenza A by PCR: NEGATIVE
Influenza B by PCR: NEGATIVE
Resp Syncytial Virus by PCR: NEGATIVE
SARS Coronavirus 2 by RT PCR: NEGATIVE

## 2023-09-30 LAB — BASIC METABOLIC PANEL WITH GFR
Anion gap: 10 (ref 5–15)
BUN: 8 mg/dL (ref 6–20)
CO2: 24 mmol/L (ref 22–32)
Calcium: 8.7 mg/dL — ABNORMAL LOW (ref 8.9–10.3)
Chloride: 103 mmol/L (ref 98–111)
Creatinine, Ser: 0.86 mg/dL (ref 0.44–1.00)
GFR, Estimated: >60 mL/min (ref >60)
Glucose, Bld: 109 mg/dL — ABNORMAL HIGH (ref 70–99)
Potassium: 3.1 mmol/L — ABNORMAL LOW (ref 3.5–5.1)
Sodium: 137 mmol/L (ref 135–145)

## 2023-09-30 LAB — GROUP A STREP BY PCR: Group A Strep by PCR: NOT DETECTED

## 2023-09-30 LAB — MONONUCLEOSIS SCREEN: Mono Screen: NEGATIVE

## 2023-09-30 MED ORDER — AMOXICILLIN-POT CLAVULANATE 875-125 MG PO TABS: 1.0000 | ORAL_TABLET | Freq: Two times a day (BID) | ORAL | 0 refills | Status: DC

## 2023-09-30 MED ORDER — AMOXICILLIN 875 MG PO TABS
875.0000 mg | ORAL_TABLET | Freq: Two times a day (BID) | ORAL | 0 refills | Status: AC
Start: 2023-09-30 — End: ?

## 2023-09-30 MED ORDER — LIDOCAINE VISCOUS HCL 2 % MT SOLN
15.0000 mL | Freq: Once | OROMUCOSAL | Status: AC
  Administered 2023-09-30: 15 mL via OROMUCOSAL
  Filled 2023-09-30: qty 15

## 2023-09-30 MED ORDER — SODIUM CHLORIDE 0.9 % IV SOLN
3.0000 g | Freq: Once | INTRAVENOUS | Status: AC
  Administered 2023-09-30: 3 g via INTRAVENOUS
  Filled 2023-09-30: qty 8

## 2023-09-30 MED ORDER — DEXAMETHASONE SODIUM PHOSPHATE 10 MG/ML IJ SOLN
10.0000 mg | Freq: Once | INTRAMUSCULAR | Status: AC
  Administered 2023-09-30: 10 mg via INTRAVENOUS
  Filled 2023-09-30: qty 1

## 2023-09-30 NOTE — ED Provider Notes (Signed)
 [facility] Emergency Department Provider Note     Event Date/Time   First MD Initiated Contact with Patient 09/30/23 2206     (approximate)   History   Sore Throat   HPI  Linda Sawyer is a 33 y.o. female with a history of obesity, depression, and heart murmur, returns to the ED the same day that she was evaluated for sore throat.  She was seen earlier in for the same complaint, and despite a negative viral panel test and strep PCR, patient was discharged with a prescription for Augmentin .  She returns to the ED endorsing that her pain is not improved as she was not discharged with a prescription for pain medicine.  Patient denies any interim fevers, chills, sweats.  She denies difficulty breathing, swallowing, or controlling oral secretions.     Physical Exam   Triage Vital Signs: ED Triage Vitals  Encounter Vitals Group     BP 09/30/23 2148 (!) 137/95     Systolic BP Percentile --      Diastolic BP Percentile --      Pulse Rate 09/30/23 2148 100     Resp 09/30/23 2148 16     Temp 09/30/23 2148 98.8 F (37.1 C)     Temp Source 09/30/23 2148 Oral     SpO2 09/30/23 2148 98 %     Weight --      Height 09/30/23 2149 5\' 11"  (1.803 m)     Head Circumference --      Peak Flow --      Pain Score 09/30/23 2149 10     Pain Loc --      Pain Education --      Exclude from Growth Chart --     Most recent vital signs: Vitals:   09/30/23 2148  BP: (!) 137/95  Pulse: 100  Resp: 16  Temp: 98.8 F (37.1 C)  SpO2: 98%    General Awake, no distress.  NAD HEENT NCAT. PERRL. EOMI. No rhinorrhea. Mucous membranes are moist.  Uvula is midline and tonsils are erythematous and edematous. CV:  Good peripheral perfusion. RRR RESP:  Normal effort. CTA ABD:  No distention.    ED Results / Procedures / Treatments   Labs (all labs ordered are listed, but only abnormal results are displayed) Labs Reviewed  BASIC METABOLIC PANEL WITH GFR - Abnormal;  Notable for the following components:      Result Value   Potassium 3.1 (*)    Glucose, Bld 109 (*)    Calcium 8.7 (*)    All other components within normal limits  CBC WITH DIFFERENTIAL/PLATELET - Abnormal; Notable for the following components:   WBC 14.0 (*)    Hemoglobin 10.6 (*)    HCT 34.3 (*)    MCV 67.9 (*)    MCH 21.0 (*)    RDW 18.0 (*)    Neutro Abs 11.1 (*)    Monocytes Absolute 1.3 (*)    All other components within normal limits  MONONUCLEOSIS SCREEN     EKG   RADIOLOGY  No results found.   PROCEDURES:  Critical Care performed: No  Procedures   MEDICATIONS ORDERED IN ED: Medications  oxyCODONE -acetaminophen  (PERCOCET/ROXICET) 5-325 MG per tablet 1 tablet (has no administration in time range)  dexamethasone  (DECADRON ) injection 10 mg (10 mg Intravenous Given 09/30/23 2229)  lidocaine  (XYLOCAINE ) 2 % viscous mouth solution 15 mL (15 mLs Mouth/Throat Given 09/30/23 2230)  Ampicillin -Sulbactam (UNASYN ) 3 g  in sodium chloride  0.9 % 100 mL IVPB (3 g Intravenous New Bag/Given 09/30/23 2345)     IMPRESSION / MDM / ASSESSMENT AND PLAN / ED COURSE  I reviewed the triage vital signs and the nursing notes.                              Differential diagnosis includes, but is not limited to, tonsillitis, pharyngitis, tonsilloliths, reflux  Patient's presentation is most consistent with acute presentation with potential threat to life or bodily function.   Patient's diagnosis is consistent with likely strep pharyngitis despite a negative strep PCR earlier.  Patient returns to the ED include endorsing increased pain and swelling to the throat.  She is afebrile on presentation, and controlling her oral secretions.  No clinical evidence or high concern for PTA on presentation.  Patient has dose 1 oral Augmentin  since discharge.  In the ED she is given a dose of Decadron  and Unasyn  via IV.  Lab results confirm a leukocytosis at 14 with a left shift and mild monocytosis.   Remaining labs are normal and reassuring including a monotest.  Patient is endorsing improvement of her symptoms at the time of this interval evaluation.  I discussed the option of admission to the hospital for further management including pain control.  Patient declined admission at this time.  She feels confident that she can manage her symptoms at home know that she is now able to feel her enlarged tonsils as much.  Patient will be discharged home with prescriptions for hydrocodone . Patient is to follow up with  ENT as discussed, as needed or otherwise directed. Patient is given ED precautions to return to the ED for any worsening or new symptoms.   FINAL CLINICAL IMPRESSION(S) / ED DIAGNOSES   Final diagnoses:  Strep pharyngitis     Rx / DC Orders   ED Discharge Orders          Ordered    HYDROcodone -acetaminophen  (NORCO/VICODIN) 5-325 MG tablet  3 times daily PRN        10/01/23 0005             Note:  This document was prepared using Dragon voice recognition software and may include unintentional dictation errors.    May Sparks, PA-C 10/01/23 0016    Claria Crofts, MD 10/08/23 [phone]

## 2023-09-30 NOTE — ED Provider Notes (Signed)
 [facility] Provider Note    Event Date/Time   First MD Initiated Contact with Patient 09/30/23 1102     (approximate)   History   Sore Throat   HPI  Linda Sawyer is a 33 y.o. female presents to the ED with complaint of sore throat.  Patient reports that she also had nasal congestion and a low-grade temperature that started 2 days ago.  She saw "white patches" in the back of her throat.  She denies any known sick contacts.     Physical Exam   Triage Vital Signs: ED Triage Vitals  Encounter Vitals Group     BP 09/30/23 1036 (!) 131/97     Systolic BP Percentile --      Diastolic BP Percentile --      Pulse Rate 09/30/23 1036 88     Resp 09/30/23 1036 18     Temp 09/30/23 1036 98.3 F (36.8 C)     Temp Source 09/30/23 1036 Oral     SpO2 09/30/23 1036 96 %     Weight 09/30/23 1035 158 lb (71.7 kg)     Height 09/30/23 1035 5\' 11"  (1.803 m)     Head Circumference --      Peak Flow --      Pain Score 09/30/23 1034 0     Pain Loc --      Pain Education --      Exclude from Growth Chart --     Most recent vital signs: Vitals:   09/30/23 1036  BP: (!) 131/97  Pulse: 88  Resp: 18  Temp: 98.3 F (36.8 C)  SpO2: 96%     General: Awake, no distress.  CV:  Good peripheral perfusion.  Resp:  Normal effort.  Abd:  No distention.  Other:  Cryptic tonsils with white exudate minimal.  Neck is supple with tender cervical lymphadenopathy more on the left than the right.  Uvula is midline.  Oral mucosa moist.  Normal speech.   ED Results / Procedures / Treatments   Labs (all labs ordered are listed, but only abnormal results are displayed) Labs Reviewed  RESP PANEL BY RT-PCR (RSV, FLU A&B, COVID)  [id number]  GROUP A STREP BY PCR      PROCEDURES:  Critical Care performed:   Procedures   MEDICATIONS ORDERED IN ED: Medications - No data to display   IMPRESSION / MDM / ASSESSMENT AND PLAN / ED COURSE  I reviewed the triage vital signs  and the nursing notes.   Differential diagnosis includes, but is not limited to, strep pharyngitis, COVID, influenza, RSV, viral pharyngitis, tonsillitis, peritonsillar cellulitis/abscess was considered.  33 year old female presents to the ED with complaint of sore throat, fever that began approximately 2 to 3 days ago.  Patient was reassured with strep and respiratory panel being negative.  Patient is being treated for tonsillitis at this time.  She is also to follow-up with Dr. Jolly Needle who is on-call for [facility].  Prescriptions for amoxicillin  875 twice daily was sent to the pharmacy for her to begin taking and also Tylenol /ibuprofen  as needed.      Patient's presentation is most consistent with acute complicated illness / injury requiring diagnostic workup.  FINAL CLINICAL IMPRESSION(S) / ED DIAGNOSES   Final diagnoses:  Tonsillitis     Rx / DC Orders   ED Discharge Orders          Ordered    amoxicillin -clavulanate (AUGMENTIN ) 875-125 MG tablet  2 times daily,   Status:  Discontinued        09/30/23 1210    amoxicillin  (AMOXIL ) 875 MG tablet  2 times daily        09/30/23 1212             Note:  This document was prepared using Dragon voice recognition software and may include unintentional dictation errors.   Stafford Eagles, PA-C 09/30/23 1404    Ruth Cove, MD 09/30/23 [phone]

## 2023-09-30 NOTE — ED Triage Notes (Signed)
 Pt via POV from home. Pt c/o sore throat, nasal congestion, and low grade fever that started Friday. States she saw white patches to the back of her throat. Denies sick contacts. Pt is A&Ox4 and NAD, ambulatory to triage.

## 2023-09-30 NOTE — Discharge Instructions (Addendum)
 Follow-up with Dr. Jolly Needle who is on-call for [facility] if any continued problems or concerns.  A prescription was sent to the pharmacy for you begin taking.  You may also take Tylenol  or ibuprofen  as needed for throat pain.  Drink lots of fluids to stay hydrated.

## 2023-09-30 NOTE — ED Triage Notes (Addendum)
 Pt to ed from home via POV for sore throat. Pt was seen earlier this morning for same. Pt was prescribed abx. Pt states "my pain isnt better and they didn't give me pain meds and the tylenol  isnt cutting it and I am just over it". Pt is caox4, in no acute distress and ambulatory in triage. Pt is able to speak and maintain normal secretions.

## 2023-10-01 ENCOUNTER — Ambulatory Visit

## 2023-10-01 MED ORDER — HYDROCODONE-ACETAMINOPHEN 5-325 MG PO TABS: 1.0000 | ORAL_TABLET | Freq: Three times a day (TID) | ORAL | 0 refills | Status: AC | PRN

## 2023-10-01 MED ORDER — OXYCODONE-ACETAMINOPHEN 5-325 MG PO TABS
1.0000 | ORAL_TABLET | Freq: Once | ORAL | Status: AC
  Administered 2023-10-01: 1 via ORAL
  Filled 2023-10-01: qty 1

## 2023-10-01 NOTE — Discharge Instructions (Addendum)
 You are being treated with the assumption that you have a strep throat infection.  There is no evidence of a peritonsillar abscess on exam.  You have been treated with IV antibiotics and steroids.  Continue to drink liquids and consider a liquid diet and to you can advance to soft and solid foods.  Take the previously prescribed antibiotic as directed.  Take the pain medicine as needed.  Follow-up with [facility] or return to the ED for worsening symptoms as discussed.

## 2023-10-11 ENCOUNTER — Ambulatory Visit

## 2023-11-21 ENCOUNTER — Encounter: Payer: Self-pay | Admitting: Emergency Medicine

## 2023-11-21 ENCOUNTER — Other Ambulatory Visit: Payer: Self-pay

## 2023-11-21 ENCOUNTER — Emergency Department
Admission: EM | Admit: 2023-11-21 | Discharge: 2023-11-21 | Disposition: A | Discharge status: Home / Self Care | Payer: Self-pay | Attending: Emergency Medicine | Admitting: Emergency Medicine

## 2023-11-21 ENCOUNTER — Emergency Department: Payer: Self-pay

## 2023-11-21 DIAGNOSIS — J189 Pneumonia, unspecified organism: Secondary | ICD-10-CM

## 2023-11-21 DIAGNOSIS — J181 Lobar pneumonia, unspecified organism: Secondary | ICD-10-CM | POA: Insufficient documentation

## 2023-11-21 LAB — RESP PANEL BY RT-PCR (RSV, FLU A&B, COVID)  RVPGX2
Influenza A by PCR: NEGATIVE
Influenza B by PCR: NEGATIVE
Resp Syncytial Virus by PCR: NEGATIVE
SARS Coronavirus 2 by RT PCR: NEGATIVE

## 2023-11-21 LAB — BASIC METABOLIC PANEL WITH GFR
Anion gap: 7 (ref 5–15)
BUN: 5 mg/dL — ABNORMAL LOW (ref 6–20)
CO2: 26 mmol/L (ref 22–32)
Calcium: 9.2 mg/dL (ref 8.9–10.3)
Chloride: 103 mmol/L (ref 98–111)
Creatinine, Ser: 0.76 mg/dL (ref 0.44–1.00)
GFR, Estimated: >60 mL/min (ref >60)
Glucose, Bld: 90 mg/dL (ref 70–99)
Potassium: 3.7 mmol/L (ref 3.5–5.1)
Sodium: 136 mmol/L (ref 135–145)

## 2023-11-21 LAB — CBC
HCT: 35.8 % — ABNORMAL LOW (ref 36.0–46.0)
Hemoglobin: 10.7 g/dL — ABNORMAL LOW (ref 12.0–15.0)
MCH: 20.5 pg — ABNORMAL LOW (ref 26.0–34.0)
MCHC: 29.9 g/dL — ABNORMAL LOW (ref 30.0–36.0)
MCV: 68.5 fL — ABNORMAL LOW (ref 80.0–100.0)
Platelets: 306 10*3/uL (ref 150–400)
RBC: 5.23 MIL/uL — ABNORMAL HIGH (ref 3.87–5.11)
RDW: 17.6 % — ABNORMAL HIGH (ref 11.5–15.5)
WBC: 5.7 10*3/uL (ref 4.0–10.5)
nRBC: 0 % (ref 0.0–0.2)

## 2023-11-21 LAB — POC URINE PREG, ED: Preg Test, Ur: NEGATIVE

## 2023-11-21 LAB — TROPONIN I (HIGH SENSITIVITY): Troponin I (High Sensitivity): 3 ng/L (ref <18)

## 2023-11-21 MED ORDER — BENZONATATE 100 MG PO CAPS: 100.0000 mg | ORAL_CAPSULE | Freq: Three times a day (TID) | ORAL | 0 refills | Status: AC | PRN

## 2023-11-21 MED ORDER — LIDOCAINE 5 % EX PTCH: 1.0000 | MEDICATED_PATCH | CUTANEOUS | 0 refills | Status: AC

## 2023-11-21 MED ORDER — LIDOCAINE 5 % EX PTCH
1.0000 | MEDICATED_PATCH | CUTANEOUS | Status: DC
  Administered 2023-11-21: 1 via TRANSDERMAL
  Filled 2023-11-21: qty 1

## 2023-11-21 MED ORDER — DOXYCYCLINE HYCLATE 100 MG PO TABS: 100.0000 mg | ORAL_TABLET | Freq: Two times a day (BID) | ORAL | 0 refills | Status: AC

## 2023-11-21 MED ORDER — IBUPROFEN 600 MG PO TABS: 600.0000 mg | ORAL_TABLET | Freq: Three times a day (TID) | ORAL | 0 refills | Status: AC | PRN

## 2023-11-21 MED ORDER — KETOROLAC TROMETHAMINE 15 MG/ML IJ SOLN
15.0000 mg | Freq: Once | INTRAMUSCULAR | Status: AC
  Administered 2023-11-21: 15 mg via INTRAVENOUS
  Filled 2023-11-21: qty 1

## 2023-11-21 NOTE — Discharge Instructions (Addendum)
 We are treating you with antibiotics and you can return to the ER if you develop worsening symptoms or any other concerns but at this time I suspect this is related to a pneumonia  IMPRESSION: Patchy left basilar infiltrates suspicious for pneumonia.

## 2023-11-21 NOTE — ED Notes (Signed)
 Per Dr. Peggi Bowels, ok to give toradol  IM.

## 2023-11-21 NOTE — ED Provider Notes (Signed)
 [facility] Provider Note    Event Date/Time   First MD Initiated Contact with Patient 11/21/23 1751     (approximate)   History   Chest Pain and Cough   HPI  Linda Sawyer is a 33 y.o. female who is otherwise healthy who comes in with a cough.  Patient reports a cough for the past 3 to 4 days and then developing some chest pain that started this morning that feels like some bricks on her chest.  She reports taking over-the-counter medications without any relief.  She does report some bodyaches.  She denies any risk factors for pulmonary embolism.  Denies any swelling her legs, contraception.  Physical Exam   Triage Vital Signs: ED Triage Vitals  Encounter Vitals Group     BP 11/21/23 1715 (!) 114/94     Systolic BP Percentile --      Diastolic BP Percentile --      Pulse Rate 11/21/23 1715 94     Resp 11/21/23 1715 20     Temp 11/21/23 1715 99.7 F (37.6 C)     Temp Source 11/21/23 1715 Oral     SpO2 11/21/23 1715 99 %     Weight 11/21/23 1718 (!) 350 lb (158.8 kg)     Height 11/21/23 1718 5' 11 (1.803 m)     Head Circumference --      Peak Flow --      Pain Score 11/21/23 1718 8     Pain Loc --      Pain Education --      Exclude from Growth Chart --     Most recent vital signs: Vitals:   11/21/23 1715  BP: (!) 114/94  Pulse: 94  Resp: 20  Temp: 99.7 F (37.6 C)  SpO2: 99%     General: Awake, no distress.  CV:  Good peripheral perfusion. Mild chest wall tenderness Resp:  Normal effort.  Clear lungs Abd:  No distention.  Soft nontender Other:  No swelling in legs.  No calf tenderness   ED Results / Procedures / Treatments   Labs (all labs ordered are listed, but only abnormal results are displayed) Labs Reviewed  BASIC METABOLIC PANEL WITH GFR - Abnormal; Notable for the following components:      Result Value   BUN 5 (*)    All other components within normal limits  CBC - Abnormal; Notable for the following components:    RBC 5.23 (*)    Hemoglobin 10.7 (*)    HCT 35.8 (*)    MCV 68.5 (*)    MCH 20.5 (*)    MCHC 29.9 (*)    RDW 17.6 (*)    All other components within normal limits  RESP PANEL BY RT-PCR (RSV, FLU A&B, COVID)  RVPGX2  POC URINE PREG, ED  TROPONIN I (HIGH SENSITIVITY)     EKG  My interpretation of EKG:  Normal sinus rhythm 89 without any ST elevation.  Some T wave inversions in lead III, aVF, V2, III V6  Reviewed prior EKG from 04/2021 and patient had similar inferior lateral T wave inversions  RADIOLOGY I have reviewed the xray personally and interpreted possible pneumonia left lower lung   PROCEDURES:  Critical Care performed: No  Procedures   MEDICATIONS ORDERED IN ED: Medications  lidocaine  (LIDODERM ) 5 % 1 patch (1 patch Transdermal Patch Applied 11/21/23 1812)  ketorolac  (TORADOL ) 15 MG/ML injection 15 mg (15 mg Intravenous Given 11/21/23 1816)  IMPRESSION / MDM / ASSESSMENT AND PLAN / ED COURSE  I reviewed the triage vital signs and the nursing notes.   Patient's presentation is most consistent with acute presentation with potential threat to life or bodily function.   Patient comes in with chest pain and coughing.  EKG does have some T wave inversions so we will get troponin to evaluate.  However I did look at a prior EKG and she has had some similar T wave inversions before.  I suspect at this time this is most likely pneumonia given patient's symptoms.  Will get x-ray to evaluate.  Consider the possibility of pulmonary embolism but patient is PERC negative with low wells score.  No evidence of DVT on examination.  Preg test was negative.  COVID, flu were negative.  BMP reassuring CBC shows normal white count stable hemoglobin.  Troponin was negative and symptoms been ongoing for greater than 3 hours with low heart score  IMPRESSION: Patchy left basilar infiltrates suspicious for pneumonia.   Discussed with patient given patient is otherwise healthy with  no recent IV antibiotics nondiabetic nonalcoholic we will treat with doxycycline  for 7 days, Tessalon  Perles     FINAL CLINICAL IMPRESSION(S) / ED DIAGNOSES   Final diagnoses:  Pneumonia of left lower lobe due to infectious organism     Rx / DC Orders   ED Discharge Orders          Ordered    doxycycline  (VIBRA -TABS) 100 MG tablet  2 times daily        11/21/23 1845    lidocaine  (LIDODERM ) 5 %  Every 24 hours        11/21/23 1845    benzonatate  (TESSALON  PERLES) 100 MG capsule  3 times daily PRN        11/21/23 1845    ibuprofen  (ADVIL ) 600 MG tablet  Every 8 hours PRN        11/21/23 1845             Note:  This document was prepared using Dragon voice recognition software and may include unintentional dictation errors.   Lubertha Rush, MD 11/21/23 [phone]

## 2023-11-21 NOTE — ED Triage Notes (Signed)
 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  C/o body aches

## 2023-11-21 NOTE — ED Notes (Signed)
 Patient Alert and oriented to baseline. Stable and ambulatory to baseline. Patient verbalized understanding of the discharge instructions.  Patient belongings were taken by the patient.

## 2023-11-26 ENCOUNTER — Ambulatory Visit

## 2023-12-31 ENCOUNTER — Encounter: Payer: Self-pay | Admitting: Family Medicine

## 2023-12-31 ENCOUNTER — Ambulatory Visit: Payer: Self-pay | Admitting: Family Medicine

## 2023-12-31 DIAGNOSIS — Z113 Encounter for screening for infections with a predominantly sexual mode of transmission: Secondary | ICD-10-CM

## 2023-12-31 DIAGNOSIS — A599 Trichomoniasis, unspecified: Secondary | ICD-10-CM

## 2023-12-31 LAB — WET PREP FOR TRICH, YEAST, CLUE
Clue Cell Exam: NEGATIVE
Trichomonas Exam: POSITIVE — AB
Yeast Exam: NEGATIVE

## 2023-12-31 LAB — HM HIV SCREENING LAB: HM HIV Screening: NEGATIVE

## 2023-12-31 MED ORDER — METRONIDAZOLE 500 MG PO TABS: 500.0000 mg | ORAL_TABLET | Freq: Two times a day (BID) | ORAL | Status: AC

## 2023-12-31 NOTE — Progress Notes (Signed)
 [facility] Department STI clinic [address] [place] [address] Main phone: [phone]  STI screening visit  Subjective:  [name] is a 33 y.o. female being seen today for an STI screening visit. The patient reports they do have symptoms.  Patient reports that they do not desire a pregnancy in the next year.   They reported they are not interested in discussing contraception today.    Patient's last menstrual period was 12/28/2023 (exact date).  Patient has the following medical conditions:  Patient Active Problem List   Diagnosis Date Noted   Vaping nicotine dependence, non-tobacco product 02/12/2020   Marijuana abuse daily 02/12/2020   Acetaminophen  overdose 06/27/2015   Obesity, unspecified 343 lbs 06/08/2014   Depressive disorder, major recur, unspecified (HCC) 07/27/2006   Chief Complaint  Patient presents with   SEXUALLY TRANSMITTED DISEASE   HPI Patient reports itching, white discharge, burning with urination, and odor for several days. Reports the partner of the person she has been having sex with called her and told her she gave them something. She has only had one sex partner in the last year. Last sex was 2 weeks ago. The woman who called her was not specific about having been tested/testing positive for any specific STI.  See flowsheet for further details and programmatic requirements Hyperlink available at the top of the signed note in blue.  Flow sheet content below:  Pregnancy Intention Screening Does the patient want to become pregnant in the next year?: No Does the patient's partner want to become pregnant in the next year?: No Would the patient like to discuss contraceptive options today?: No Reason For STD Screen STD Screening: Has symptoms Have you ever had an STD?: Yes History of Antibiotic use in the past 2 weeks?: No STD Symptoms Denies all: No Genital Itching: Yes Lower abdominal pain: No Discharge:  Yes Discharge s/s: white and thick Dysuria: Yes Genital ulcer / lesion: No Rash: No Vaginal irritation: Yes Oral / Other skin ulcer: No Pain with sex: No Sore Throat: No Visual Changes: No Vaginal Bleeding: No Risk Factors for Hep B Household, sexual, or needle sharing contact of a person infected with Hep B: No Sexual contact with a person who uses drugs not as prescribed?: No Currently or Ever used drugs not as prescribed: No HIV Positive: No PRep Patient: No Men who have sex with men: No Have Hepatitis C: No History of Incarceration: No History of Homeslessness?: No Anal sex following anal drug use?: No Risk Factors for Hep C Currently using drugs not as prescribed: No Sexual partner(s) currently using drugs as not prescribed: No History of drug use: No HIV Positive: No People with a history of incarceration: No People born between the years of 42 and 58: No Abuse History Has patient ever been abused physically?: No Has patient ever been abused sexually?: No Does patient feel they have a problem with Anxiety?: No Does patient feel they have a problem with Depression?: No Counseling Patient counseled to use condoms with all sex: Condoms declined RTC in 2-3 weeks for test results: Yes Clinic will call if test results abnormal before test result appt.: Yes Test results given to patient Patient counseled to use condoms with all sex: Condoms declined   Screening for MPX risk: Does the patient have an unexplained rash? No Is the patient MSM? No Does the patient endorse multiple sex partners or anonymous sex partners? No Did the patient have close or sexual contact  with a person diagnosed with MPX? No Has the patient traveled outside the [place]  where MPX is endemic? No Is there a high clinical suspicion for MPX-- evidenced by one of the following No  -Unlikely to be chickenpox  -Lymphadenopathy  -Rash that present in same phase of evolution on any given body  part  Screenings: Last HIV test per patient/review of record was  Lab Results  Component Value Date   HMHIVSCREEN Negative - Validated 07/17/2023    Lab Results  Component Value Date   HIV NON REACTIVE 08/31/2016     Last HEPC test per patient/review of record was  Lab Results  Component Value Date   HMHEPCSCREEN Negative-Validated 10/21/2020   No components found for: HEPC   Last HEPB test per patient/review of record was No components found for: HMHEPBSCREEN   Patient reports last pap was:   Lab Results  Component Value Date   SPECADGYN Comment 07/29/2021   Result Date Procedure Results Follow-ups  07/29/2021 IGP, Aptima HPV DIAGNOSIS:: Comment Specimen adequacy:: Comment Clinician Provided ICD10: Comment Performed by:: Comment PAP Smear Comment: . Note:: Comment Test Methodology: Comment HPV Aptima: Negative   07/10/2017 HM PAP SMEAR HM Pap smear: Negative     Immunization history:  Immunization History  Administered Date(s) Administered   Influenza,inj,Quad PF,6+ Mos 06/29/2015   PPD Test 12/22/2022   Tdap 11/01/2018    The following portions of the patient's history were reviewed and updated as appropriate: allergies, current medications, past medical history, past social history, past surgical history and problem list.  Objective:  There were no vitals filed for this visit.  Physical Exam Exam conducted with a chaperone present [employer]).  Constitutional:      Appearance: Normal appearance.  HENT:     Head: Normocephalic.     Mouth/Throat:     Mouth: Mucous membranes are moist.  Eyes:     General: No scleral icterus.       Right eye: No discharge.        Left eye: No discharge.  Pulmonary:     Effort: Pulmonary effort is normal.  Genitourinary:    General: Normal vulva.     Labia:        Right: No rash, tenderness, lesion or injury.        Left: No rash, tenderness, lesion or injury.      Vagina: Normal. No vaginal discharge,  tenderness or lesions.     Cervix: No discharge, friability, lesion, erythema or cervical bleeding.     Comments: Menstrual blood Lymphadenopathy:     Cervical: No cervical adenopathy.     Right cervical: No superficial or posterior cervical adenopathy.    Left cervical: No superficial or posterior cervical adenopathy.  Skin:    General: Skin is warm and dry.  Neurological:     Mental Status: She is alert.  Psychiatric:        Mood and Affect: Mood normal.        Behavior: Behavior normal.    Assessment and Plan:  [place] MACON LESESNE is a 33 y.o. female presenting to the [facility] Department for STI screening  1. Screening for venereal disease (Primary)  - WET PREP FOR TRICH, YEAST, CLUE - Chlamydia/Gonorrhea [facility] - Syphilis Serology, [facility] - HIV [place] [facility]  2. Trichomonas vaginalis infection  - metroNIDAZOLE  (FLAGYL ) 500 MG tablet; Take 1 tablet (500 mg total) by mouth 2 (two) times daily for 7 days.  Patient accepted the following screenings: vaginal CT/GC swab, vaginal wet prep, HIV, and RPR Patient meets criteria for HepB screening? No. Ordered? no Patient meets criteria for HepC screening? No. Ordered? no  Treat wet prep per standing order Discussed time line for [facility] results and that patient will be called with positive results and encouraged patient to call if she had not heard in 2 weeks.  Counseled to return or seek care for continued or worsening symptoms Recommended repeat testing in 3 months with positive results. Recommended condom use with all sex for STI prevention.   Return if symptoms worsen or fail to improve.  No future appointments.  Damien FORBES Satchel [facility]

## 2023-12-31 NOTE — Progress Notes (Signed)
 Patient here for STD screening. Wet prep results reviewed; patient treated with Metronidazole  #14 BID x 7 days per order by FORBES Satchel, NP. I provided counseling today regarding the medication. We discussed the medication, the side effects and when to call clinic. Condoms declined. All questions answered and verbalizes understanding.   Doyce CINDERELLA Shuck, RN

## 2024-01-09 ENCOUNTER — Encounter: Payer: Self-pay | Admitting: Family Medicine

## 2024-01-18 ENCOUNTER — Emergency Department: Payer: Self-pay

## 2024-01-18 ENCOUNTER — Emergency Department
Admission: EM | Admit: 2024-01-18 | Discharge: 2024-01-18 | Disposition: A | Discharge status: Home / Self Care | Payer: Self-pay

## 2024-01-18 ENCOUNTER — Other Ambulatory Visit: Payer: Self-pay

## 2024-01-18 DIAGNOSIS — M79672 Pain in left foot: Secondary | ICD-10-CM | POA: Insufficient documentation

## 2024-01-18 NOTE — ED Notes (Signed)
 See triage note  Presents with pain to left foot  with swelling   Unsure of injury

## 2024-01-18 NOTE — Discharge Instructions (Signed)
 You were evaluated in the ED for left foot pain.  Your x-rays are normal.  Alternate taking Tylenol  and ibuprofen  for pain as needed.  Apply ice to the affected area you can also try adding a heating pad to the area.  Limit your physical activity and bear weight as tolerated with the assistance of crutches for 1 week.

## 2024-01-18 NOTE — ED Provider Notes (Signed)
 [facility] Emergency Department Provider Note     Event Date/Time   First MD Initiated Contact with Patient 01/18/24 1030     (approximate)   History   Foot Pain   HPI  Linda Sawyer is a 33 y.o. female presents to the ED for evaluation of left foot pain localized to the lateral aspect x 3 days.  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  She cannot recall an injury or falling or tripping, however she noted pain to the touch of day later and inability to bear weight.  Patient declines taking anything for pain.     Physical Exam   Triage Vital Signs: ED Triage Vitals  Encounter Vitals Group     BP 01/18/24 1018 (!) 113/93     Girls Systolic BP Percentile --      Girls Diastolic BP Percentile --      Boys Systolic BP Percentile --      Boys Diastolic BP Percentile --      Pulse Rate 01/18/24 1018 76     Resp 01/18/24 1018 18     Temp 01/18/24 1018 98.1 F (36.7 C)     Temp Source 01/18/24 1018 Oral     SpO2 01/18/24 1018 98 %     Weight 01/18/24 1029 (!) 350 lb 1.5 oz (158.8 kg)     Height 01/18/24 1029 5' 11 (1.803 m)     Head Circumference --      Peak Flow --      Pain Score 01/18/24 1017 7     Pain Loc --      Pain Education --      Exclude from Growth Chart --     Most recent vital signs: Vitals:   01/18/24 1018  BP: (!) 113/93  Pulse: 76  Resp: 18  Temp: 98.1 F (36.7 C)  SpO2: 98%    General Awake, no distress.  HEENT NCAT.  CV:  Good peripheral perfusion.  RESP:  Normal effort.  ABD:  No distention.  Other:  No visible deformity to the left foot.  No noted swelling.  Tenderness to palpation with light touch to lateral hindfoot region.  Pedal pulses palpated.  F ROM of ankle joint and all digits without difficulty.  unable to bear weight secondary to pain   ED Results / Procedures / Treatments   Labs (all labs ordered are listed, but only abnormal results are displayed) Labs Reviewed - No data  to display  RADIOLOGY  I personally viewed and evaluated these images as part of my medical decision making, as well as reviewing the written report by the radiologist.  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 Reports pain and swelling. Pain mainly on lateral side of left foot. FINDINGS: BONES AND JOINTS: No acute fracture. No focal osseous lesion. No joint dislocation. SOFT TISSUES: The soft tissues are unremarkable. IMPRESSION: 1. No acute fracture or dislocation. 2. No other significant abnormality. Electronically signed by: Katheleen Faes MD 01/18/2024 10:42 AM EDT RP Workstation: [id number]    PROCEDURES:  Critical Care performed: No  Procedures   MEDICATIONS ORDERED IN ED: Medications - No data to display   IMPRESSION / MDM / ASSESSMENT AND PLAN / ED COURSE  I reviewed the  triage vital signs and the nursing notes.                              Clinical Course as of 01/18/24 1121  Fri Jan 18, 2024  1055 Pain medication offered patient declined. [MH]  1059 DG Foot Complete Left IMPRESSION: 1. No acute fracture or dislocation. 2. No other significant abnormality.   [MH]    Clinical Course User Index [MH] Margrette Monte A, PA-C    33 y.o. female presents to the emergency department for evaluation and treatment of acute left foot pain. See HPI for further details.   Differential diagnosis includes, but is not limited to fracture, dislocation, sprain, plantar fasciitis, contusion  Patient's presentation is most consistent with acute complicated illness / injury requiring diagnostic workup.  Patient is alert and oriented.  She is hemodynamically stable.  Physical exam findings are stated above and overall benign.  X-rays are reassuring.  Presentation  consistent with mild contusion versus sprain.  Less likely plantar fasciitis as localized pain is to lateral hindfoot region.  Will place patient in cam boot and provide crutches with weightbearing status as tolerated.  Encouraged RICE therapy at home and education on this was provided.  Patient verbalized understanding.  She is in stable condition for discharge home.  Advised to follow-up with orthopedics if symptoms do not improve.  ED return precaution discussed.  FINAL CLINICAL IMPRESSION(S) / ED DIAGNOSES   Final diagnoses:  Foot pain, left   Rx / DC Orders   ED Discharge Orders     None      Note:  This document was prepared using Dragon voice recognition software and may include unintentional dictation errors.    Margrette Monte A, PA-C 01/18/24 1121    Clarine Ozell LABOR, MD 01/21/24 0001

## 2024-01-18 NOTE — ED Triage Notes (Signed)
 Pt to ED via POV from home. 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 Pt reports increased pain and swelling to right foot. Pain 7/10. No SOB or CP.

## 2024-02-01 ENCOUNTER — Encounter: Payer: Self-pay | Admitting: *Deleted

## 2024-02-01 ENCOUNTER — Other Ambulatory Visit: Payer: Self-pay

## 2024-02-01 DIAGNOSIS — R112 Nausea with vomiting, unspecified: Secondary | ICD-10-CM | POA: Insufficient documentation

## 2024-02-01 DIAGNOSIS — R509 Fever, unspecified: Secondary | ICD-10-CM | POA: Insufficient documentation

## 2024-02-01 LAB — POC URINE PREG, ED: Preg Test, Ur: NEGATIVE

## 2024-02-01 NOTE — ED Triage Notes (Signed)
 Pt says that she woke up this morning not feeling well, had a headache when she woke up and had episode of vomiting tonight at work. Generalized abdominal pain. Temp (101) and took motrin  for the same.

## 2024-02-02 ENCOUNTER — Emergency Department
Admission: EM | Admit: 2024-02-02 | Discharge: 2024-02-02 | Disposition: A | Discharge status: Home / Self Care | Payer: Self-pay | Attending: Emergency Medicine | Admitting: Emergency Medicine

## 2024-02-02 DIAGNOSIS — R112 Nausea with vomiting, unspecified: Secondary | ICD-10-CM

## 2024-02-02 LAB — COMPREHENSIVE METABOLIC PANEL WITH GFR
ALT: 13 U/L (ref 0–44)
AST: 18 U/L (ref 15–41)
Albumin: 3.3 g/dL — ABNORMAL LOW (ref 3.5–5.0)
Alkaline Phosphatase: 62 U/L (ref 38–126)
Anion gap: 12 (ref 5–15)
BUN: 8 mg/dL (ref 6–20)
CO2: 21 mmol/L — ABNORMAL LOW (ref 22–32)
Calcium: 8.6 mg/dL — ABNORMAL LOW (ref 8.9–10.3)
Chloride: 106 mmol/L (ref 98–111)
Creatinine, Ser: 0.77 mg/dL (ref 0.44–1.00)
GFR, Estimated: >60 mL/min (ref >60)
Glucose, Bld: 126 mg/dL — ABNORMAL HIGH (ref 70–99)
Potassium: 3.4 mmol/L — ABNORMAL LOW (ref 3.5–5.1)
Sodium: 139 mmol/L (ref 135–145)
Total Bilirubin: 0.4 mg/dL (ref 0.0–1.2)
Total Protein: 7.7 g/dL (ref 6.5–8.1)

## 2024-02-02 LAB — LIPASE, BLOOD: Lipase: 33 U/L (ref 11–51)

## 2024-02-02 LAB — URINALYSIS, ROUTINE W REFLEX MICROSCOPIC
Bilirubin Urine: NEGATIVE
Glucose, UA: NEGATIVE mg/dL
Ketones, ur: NEGATIVE mg/dL
Leukocytes,Ua: NEGATIVE
Nitrite: NEGATIVE
Protein, ur: 30 mg/dL — AB
Specific Gravity, Urine: 1.029 (ref 1.005–1.030)
pH: 5 (ref 5.0–8.0)

## 2024-02-02 LAB — CBC
HCT: 33.6 % — ABNORMAL LOW (ref 36.0–46.0)
Hemoglobin: 10.3 g/dL — ABNORMAL LOW (ref 12.0–15.0)
MCH: 20.4 pg — ABNORMAL LOW (ref 26.0–34.0)
MCHC: 30.7 g/dL (ref 30.0–36.0)
MCV: 66.4 fL — ABNORMAL LOW (ref 80.0–100.0)
Platelets: 312 K/uL (ref 150–400)
RBC: 5.06 MIL/uL (ref 3.87–5.11)
RDW: 18 % — ABNORMAL HIGH (ref 11.5–15.5)
WBC: 8.3 K/uL (ref 4.0–10.5)
nRBC: 0 % (ref 0.0–0.2)

## 2024-02-02 MED ORDER — ONDANSETRON 4 MG PO TBDP
4.0000 mg | ORAL_TABLET | Freq: Once | ORAL | Status: AC
  Administered 2024-02-02: 4 mg via ORAL
  Filled 2024-02-02: qty 1

## 2024-02-02 MED ORDER — ONDANSETRON HCL 4 MG PO TABS: 4.0000 mg | ORAL_TABLET | Freq: Four times a day (QID) | ORAL | 0 refills | Status: AC | PRN

## 2024-02-02 NOTE — Discharge Instructions (Signed)
 You are seen in the ER today for evaluation of your vomiting.  I suspect you may have a viral illness.  Your testing was fortunately overall reassuring.  However, if you develop worsening symptoms including worsening headache or worsening abdominal pain, please return to the ER for further evaluation.  I sent a prescription for nausea medication to your pharmacy that you can take as needed.

## 2024-02-02 NOTE — ED Notes (Signed)
 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  She believes one of the kids at her job might have gotten her sick.

## 2024-02-02 NOTE — ED Notes (Signed)
 PO challenge passed

## 2024-02-02 NOTE — ED Provider Notes (Signed)
 [facility] Provider Note    Event Date/Time   First MD Initiated Contact with Patient 02/02/24 0030     (approximate)   History   Emesis   HPI  Linda Sawyer is a 33 year old female presenting to the emergency department for evaluation of vomiting.  Patient reports that this morning she had onset of nausea and vomiting.  Developed a fever with a Tmax of 101, took ibuprofen .  Reported generalized abdominal pain in triage, but denies abdominal pain to me.  Deny significant headache. No numbness, tingling, focal weakness.  No diarrhea.  Does report that she has been around multiple sick children with similar symptoms recently.     Physical Exam   Triage Vital Signs: ED Triage Vitals  Encounter Vitals Group     BP 02/01/24 2324 117/81     Girls Systolic BP Percentile --      Girls Diastolic BP Percentile --      Boys Systolic BP Percentile --      Boys Diastolic BP Percentile --      Pulse Rate 02/01/24 2324 86     Resp 02/01/24 2324 16     Temp 02/01/24 2324 98.4 F (36.9 C)     Temp src --      SpO2 02/01/24 2324 100 %     Weight --      Height --      Head Circumference --      Peak Flow --      Pain Score 02/01/24 2322 6     Pain Loc --      Pain Education --      Exclude from Growth Chart --     Most recent vital signs: Vitals:   02/01/24 2324  BP: 117/81  Pulse: 86  Resp: 16  Temp: 98.4 F (36.9 C)  SpO2: 100%     General: Awake, interactive  CV:  Regular rate, good peripheral perfusion.  Resp:  Unlabored respirations.  Abd:  Nondistended, soft, nontender Neuro:  Symmetric facial movement, fluid speech, moving extremity spontaneously and equally   ED Results / Procedures / Treatments   Labs (all labs ordered are listed, but only abnormal results are displayed) Labs Reviewed  COMPREHENSIVE METABOLIC PANEL WITH GFR - Abnormal; Notable for the following components:      Result Value   Potassium 3.4 (*)    CO2 21 (*)     Glucose, Bld 126 (*)    Calcium 8.6 (*)    Albumin 3.3 (*)    All other components within normal limits  CBC - Abnormal; Notable for the following components:   Hemoglobin 10.3 (*)    HCT 33.6 (*)    MCV 66.4 (*)    MCH 20.4 (*)    RDW 18.0 (*)    All other components within normal limits  URINALYSIS, ROUTINE W REFLEX MICROSCOPIC - Abnormal; Notable for the following components:   Color, Urine YELLOW (*)    APPearance HAZY (*)    Hgb urine dipstick SMALL (*)    Protein, ur 30 (*)    Bacteria, UA RARE (*)    All other components within normal limits  LIPASE, BLOOD  POC URINE PREG, ED     EKG EKG independently reviewed and interpreted by myself demonstrates:    RADIOLOGY Imaging independently reviewed and interpreted by myself demonstrates:   Formal Radiology Read:  No results found.  PROCEDURES:  Critical Care performed: No  Procedures  MEDICATIONS ORDERED IN ED: Medications  ondansetron  (ZOFRAN -ODT) disintegrating tablet 4 mg (4 mg Oral Given 02/02/24 0144)     IMPRESSION / MDM / ASSESSMENT AND PLAN / ED COURSE  I reviewed the triage vital signs and the nursing notes.  Differential diagnosis includes, but is not limited to, viral illness, UTI, lower suspicion pancreatitis, biliary pathology, other acute intra-abdominal process given reassuring abdominal exam, low suspicion acute intracranial process given reassuring neurologic exam  Patient's presentation is most consistent with acute presentation with potential threat to life or bodily function.  33 year old female presenting with fever and vomiting.  Reassuring exam here.  Labs with stable anemia, normal white blood cell count, CMP without critical derangements.  Urine without evidence of infection.  Normal lipase.  On my exam, no abdominal tenderness, do not think there is an indication for imaging currently.  Discussed IV fluids and Zofran , but patient preferred to trial oral medications.  She was given ODT  Zofran  with improvement in her symptoms.  She was able to tolerate p.o.  She is comfortable discharge home.  Strict return precautions provided.  Patient discharged in stable condition.       FINAL CLINICAL IMPRESSION(S) / ED DIAGNOSES   Final diagnoses:  Nausea and vomiting, unspecified vomiting type     Rx / DC Orders   ED Discharge Orders          Ordered    ondansetron  (ZOFRAN ) 4 MG tablet  Every 6 hours PRN        02/02/24 0347             Note:  This document was prepared using Dragon voice recognition software and may include unintentional dictation errors.   Levander Slate, MD 02/02/24 [phone]

## 2024-02-06 ENCOUNTER — Ambulatory Visit: Payer: Self-pay | Admitting: Pediatrics

## 2024-02-27 ENCOUNTER — Other Ambulatory Visit: Payer: Self-pay

## 2024-02-27 ENCOUNTER — Emergency Department
Admission: EM | Admit: 2024-02-27 | Discharge: 2024-02-27 | Disposition: A | Discharge status: Home / Self Care | Attending: Emergency Medicine | Admitting: Emergency Medicine

## 2024-02-27 DIAGNOSIS — R011 Cardiac murmur, unspecified: Secondary | ICD-10-CM | POA: Insufficient documentation

## 2024-02-27 DIAGNOSIS — R030 Elevated blood-pressure reading, without diagnosis of hypertension: Secondary | ICD-10-CM | POA: Insufficient documentation

## 2024-02-27 DIAGNOSIS — J069 Acute upper respiratory infection, unspecified: Secondary | ICD-10-CM | POA: Insufficient documentation

## 2024-02-27 LAB — RESP PANEL BY RT-PCR (RSV, FLU A&B, COVID)  RVPGX2
Influenza A by PCR: NEGATIVE
Influenza B by PCR: NEGATIVE
Resp Syncytial Virus by PCR: NEGATIVE
SARS Coronavirus 2 by RT PCR: NEGATIVE

## 2024-02-27 MED ORDER — ONDANSETRON 4 MG PO TBDP: 4.0000 mg | ORAL_TABLET | Freq: Three times a day (TID) | ORAL | 0 refills | Status: AC | PRN

## 2024-02-27 MED ORDER — GUAIFENESIN ER 600 MG PO TB12: 600.0000 mg | ORAL_TABLET | Freq: Two times a day (BID) | ORAL | 0 refills | Status: AC

## 2024-02-27 NOTE — ED Notes (Signed)
 Pt states she does not want this RN to ask the provider for any medication for her headache

## 2024-02-27 NOTE — Discharge Instructions (Addendum)
 You have been seen in the Emergency Department (ED) today for a likely viral illness.  Please drink plenty of clear fluids (water, Gatorade, chicken broth, etc).  You may use Tylenol  and/or Motrin  according to label instructions.  You can alternate between the two without any side effects.  You may also use the Zofran  and Mucinex  prescriptions as needed.  Call your doctor or return to the Emergency Department (ED) if you are unable to tolerate fluids due to vomiting, have worsening trouble breathing, become extremely tired or difficult to awaken, or if you develop any other symptoms that concern you.   Please also follow-up with the cardiologist listed in this paperwork for evaluation of your heart murmur.

## 2024-02-27 NOTE — ED Triage Notes (Signed)
 Pt c/o cough, chills, and SOB. Pt concerned for COVID.

## 2024-02-27 NOTE — ED Provider Notes (Signed)
 [facility] Provider Note    Event Date/Time   First MD Initiated Contact with Patient 02/27/24 1420     (approximate)   History   Cough   HPI  Linda Sawyer is a 33 y.o. female  with a past medical history of unspecified heart murmur, acetaminophen  overdose, major depression, marijuana use, nicotine dependence presents to the emergency department with cough, chills, vomiting, myalgias, headache, and dyspnea on exertion that started yesterday.  Denies fever, chest pain, current SOB, abdominal pain, diarrhea, otalgia, sore throat.  Patient states she works at [employer] and multiple coworkers have been sick and diagnosed with COVID.  No recent travel.  Patient has been using NyQuil and TheraFlu at home without much improvement in her symptoms.  Patient reports she was seen at [facility] department a couple of years ago and told she had a subtle heart murmur, but reports it has not given her any problems regarding fatigue, trouble breathing or chest pain, weakness.  Patient has a appointment scheduled in October with a new PCP.    Physical Exam   Triage Vital Signs: ED Triage Vitals [02/27/24 1346]  Encounter Vitals Group     BP (!) 129/94     Girls Systolic BP Percentile      Girls Diastolic BP Percentile      Boys Systolic BP Percentile      Boys Diastolic BP Percentile      Pulse Rate 90     Resp 20     Temp 97.9 F (36.6 C)     Temp Source Oral     SpO2 100 %     Weight      Height      Head Circumference      Peak Flow      Pain Score 0     Pain Loc      Pain Education      Exclude from Growth Chart     Most recent vital signs: Vitals:   02/27/24 1346  BP: (!) 129/94  Pulse: 90  Resp: 20  Temp: 97.9 F (36.6 C)  SpO2: 100%    General: Awake, in no acute distress. Appears stated age. Head: Normocephalic, atraumatic. Eyes: No scleral icterus or conjunctival injection. Ears/Nose/Throat: TMs intact b/l. Nares patent, no  nasal discharge. Oropharynx moist, no erythema or exudate. Dentition intact. CV: Good peripheral perfusion. Radial pulses 2+ b/l. Cap refill <2 sec. Systolic murmur heard. Respiratory:Normal respiratory effort.  No respiratory distress. CTAB. No wheezes, rales or rhonchi. GI: Soft, non-distended, non-tender.  Skin:Warm, dry, intact. No rashes, lesions, or ecchymosis. No cyanosis or pallor. Neurological: A&Ox4 to person, place, time, and situation.   ED Results / Procedures / Treatments   Labs (all labs ordered are listed, but only abnormal results are displayed) Labs Reviewed  RESP PANEL BY RT-PCR (RSV, FLU A&B, COVID)  [id number]     EKG     RADIOLOGY    PROCEDURES:  Critical Care performed: No   Procedures   MEDICATIONS ORDERED IN ED: Medications - No data to display   IMPRESSION / MDM / ASSESSMENT AND PLAN / ED COURSE  I reviewed the triage vital signs and the nursing notes.                              Differential diagnosis includes, but is not limited to, COVID, flu, RSV, other viral illness, unspecified heart  murmur  Patient's presentation is most consistent with acute complicated illness / injury requiring diagnostic workup.  Patient is a 33 year old female presenting with viral URI symptoms.  Respiratory panel ordered and was negative for COVID, flu, RSV.  Given the sick contact with multiple people at work having COVID, I feel that we may not have gotten an adequate swab here.  Treatment remains the same.  Will send her home with Zofran  and Mucinex  prescriptions.  Discussed using Tylenol  at home for pain or fever.  Work note provided.  She told me she had a heart murmur diagnosed at [facility] department a few years back.  On exam it is hard to tell whether there is a subtle murmur or not; she is not having any chest pain or shortness of breath or fatigue or weakness.  She does have DOE, but claims it has only started since the viral symptoms.  Resting  comfortably in the room, afebrile, nontoxic-appearing.  Will have her follow-up with a cardiologist given she has not had any evaluation for this suspected murmur.  She should also keep her appointment with her new primary care provider in October.  The patient may return to the emergency department for any new, worsening, or concerning symptoms. Patient was given the opportunity to ask questions; all questions were answered. Emergency department return precautions were discussed with the patient.  Patient is in agreement to the treatment plan.  Patient is stable for discharge.   FINAL CLINICAL IMPRESSION(S) / ED DIAGNOSES   Final diagnoses:  Viral URI with cough  Heart murmur  Elevated blood pressure reading     Rx / DC Orders   ED Discharge Orders          Ordered    guaiFENesin  (MUCINEX ) 600 MG 12 hr tablet  2 times daily        02/27/24 1533    ondansetron  (ZOFRAN -ODT) 4 MG disintegrating tablet  Every 8 hours PRN        02/27/24 1533    Ambulatory referral to Cardiology       Comments: If you have not heard from the Cardiology office within the next 72 hours please call [phone].   02/27/24 1535             Note:  This document was prepared using Dragon voice recognition software and may include unintentional dictation errors.     Sheron Salm, PA-C 02/27/24 1539    Dorothyann Drivers, MD 02/27/24 1845

## 2024-02-28 ENCOUNTER — Ambulatory Visit: Payer: Self-pay | Admitting: Cardiovascular Disease

## 2024-02-28 DIAGNOSIS — R011 Cardiac murmur, unspecified: Secondary | ICD-10-CM | POA: Insufficient documentation

## 2024-02-28 NOTE — Progress Notes (Deleted)
 Cardiology Office Note  Date:  02/28/2024   ID:  Linda Sawyer, DOB [birth year], MRN [id number]  PCP:  Patient, No Pcp Per   No chief complaint on file.   HPI:  Linda Sawyer a 33 y.o. femalewith past medical history of: Depression Smoker Tylenol  overdose Frequent visits to the emergency room Who presents by referral from [name] for heart murmur   Seen in the emergency room yesterday for cough, chills vomiting malaise headache shortness of breath started yesterday Sick contacts with COVID In the ER respiratory panel negative for COVID flu RSV, may have been poor swab  PMH:   has a past medical history of Heart murmur and Screening for venereal disease (02/05/2023).  PSH:    Past Surgical History:  Procedure Laterality Date   HAND SURGERY     i was 5    Current Outpatient Medications  Medication Sig Dispense Refill   amoxicillin  (AMOXIL ) 875 MG tablet Take 1 tablet (875 mg total) by mouth 2 (two) times daily. 20 tablet 0   benzonatate  (TESSALON  PERLES) 100 MG capsule Take 1 capsule (100 mg total) by mouth 3 (three) times daily as needed for cough. 30 capsule 0   guaiFENesin  (MUCINEX ) 600 MG 12 hr tablet Take 1 tablet (600 mg total) by mouth 2 (two) times daily for 5 days. 10 tablet 0   medroxyPROGESTERone  (PROVERA ) 5 MG tablet Take 1 tablet (5 mg total) by mouth daily. 10 tablet 0   ondansetron  (ZOFRAN -ODT) 4 MG disintegrating tablet Take 1 tablet (4 mg total) by mouth every 8 (eight) hours as needed for nausea or vomiting. 20 tablet 0   No current facility-administered medications for this visit.     Allergies:   Valium  [diazepam ] and Chocolate   Social History:  The patient  reports that she quit smoking about 9 years ago. Her smoking use included e-cigarettes and cigarettes. She has never used smokeless tobacco. She reports that she does not currently use alcohol. She reports current drug use. Frequency: 7.00 times per week. Drug: Marijuana.   Family History:    family history includes Asthma in her brother; Depression in her brother; Diabetes in her maternal grandmother and mother; Hodgkin's lymphoma in her maternal aunt; Hypertension in her mother and sister; Lung cancer in her paternal grandmother; Pancreatic cancer in her paternal grandfather.    Review of Systems: ROS   PHYSICAL EXAM: VS:  LMP 02/06/2024  , BMI There is no height or weight on file to calculate BMI. GEN: Well nourished, well developed, in no acute distress HEENT: normal Neck: no JVD, carotid bruits, or masses Cardiac: RRR; no murmurs, rubs, or gallops,no edema  Respiratory:  clear to auscultation bilaterally, normal work of breathing GI: soft, nontender, nondistended, + BS MS: no deformity or atrophy Skin: warm and dry, no rash Neuro:  Strength and sensation are intact Psych: euthymic mood, full affect    Recent Labs: 04/11/2023: Magnesium  2.2 02/01/2024: ALT 13; BUN 8; Creatinine, Ser 0.77; Hemoglobin 10.3; Platelets 312; Potassium 3.4; Sodium 139    Lipid Panel No results found for: CHOL, HDL, LDLCALC, TRIG    Wt Readings from Last 3 Encounters:  01/18/24 (!) 350 lb 1.5 oz (158.8 kg)  11/21/23 (!) 350 lb (158.8 kg)  09/30/23 158 lb (71.7 kg)       ASSESSMENT AND PLAN:  Problem List Items Addressed This Visit   None    Disposition:   F/U  12 months   Total encounter time more than  30 minutes  Greater than 50% was spent in counseling and coordination of care with the patient    Signed, Velinda Lunger, M.D., Ph.D. [facility] [place], [place] [phone]

## 2024-03-03 ENCOUNTER — Encounter: Payer: Self-pay | Admitting: Cardiology

## 2024-03-03 ENCOUNTER — Ambulatory Visit: Payer: Self-pay | Attending: Cardiology | Admitting: Cardiology

## 2024-03-03 VITALS — BP 135/85 | HR 76 | Ht 71.0 in | Wt 362.2 lb

## 2024-03-03 DIAGNOSIS — R0602 Shortness of breath: Secondary | ICD-10-CM

## 2024-03-03 DIAGNOSIS — R011 Cardiac murmur, unspecified: Secondary | ICD-10-CM

## 2024-03-03 NOTE — Progress Notes (Signed)
 Cardiology Office Note:    Date:  03/03/2024   ID:  Linda REBUCK, DOB [birth year], MRN [id number]  PCP:  Patient, No Pcp Per   [facility] Providers Cardiologist:  None     Referring MD: Sheron Salm, PA-C   Chief Complaint  Patient presents with   New Patient (Initial Visit)    Pt shortness of breath.   Linda Sawyer is a 33 y.o. female who is being seen today for the evaluation of cardiac murmur at the request of Sheron, [place], [place].   History of Present Illness:    Linda Sawyer is a 33 y.o. female with a hx of obesity, who presents due to shortness of breath and a cardiac murmur.  Patient presented to the ED 5 days ago due to a cough.  Cardiac murmur noted on exam prompting referral to cardiology.  She endorses shortness of breath with exertion, denies chest pain, denies any personal or family history of heart disease.  States being told about a year ago of having a heart murmur.  Denies palpitations, presyncope or syncope.  Past Medical History:  Diagnosis Date   Heart murmur    Screening for venereal disease 02/05/2023    Past Surgical History:  Procedure Laterality Date   HAND SURGERY     i was 5    Current Medications: Current Meds  Medication Sig   amoxicillin  (AMOXIL ) 875 MG tablet Take 1 tablet (875 mg total) by mouth 2 (two) times daily.   benzonatate  (TESSALON  PERLES) 100 MG capsule Take 1 capsule (100 mg total) by mouth 3 (three) times daily as needed for cough.   guaiFENesin  (MUCINEX ) 600 MG 12 hr tablet Take 1 tablet (600 mg total) by mouth 2 (two) times daily for 5 days.   medroxyPROGESTERone  (PROVERA ) 5 MG tablet Take 1 tablet (5 mg total) by mouth daily.   ondansetron  (ZOFRAN -ODT) 4 MG disintegrating tablet Take 1 tablet (4 mg total) by mouth every 8 (eight) hours as needed for nausea or vomiting.     Allergies:   Valium  [diazepam ] and Chocolate   Social History   Socioeconomic History   Marital status: Single    Spouse name: Not on  file   Number of children: Not on file   Years of education: Not on file   Highest education level: Not on file  Occupational History   Not on file  Tobacco Use   Smoking status: Former    Current packs/day: 0.00    Types: E-cigarettes, Cigarettes    Quit date: 03/01/2015    Years since quitting: 9.0   Smokeless tobacco: Never  Vaping Use   Vaping status: Never Used  Substance and Sexual Activity   Alcohol use: Not Currently    Comment: socially   Drug use: Yes    Frequency: 7.0 times per week    Types: Marijuana   Sexual activity: Yes    Partners: Male    Birth control/protection: Condom  Other Topics Concern   Not on file  Social History Narrative   Not on file   Social Drivers of Health   Financial Resource Strain: High Risk (10/08/2023)   Received from [facility]   Overall Financial Resource Strain (CARDIA)    Difficulty of Paying Living Expenses: Hard  Food Insecurity: Food Insecurity Present (10/08/2023)   Received from [facility]   Hunger Vital Sign    Within the past 12 months, you worried that your food would  run out before you got the money to buy more.: Never true    Within the past 12 months, the food you bought just didn't last and you didn't have money to get more.: Sometimes true  Transportation Needs: Unmet Transportation Needs (10/08/2023)   Received from [facility]   PRAPARE - Transportation    In the past 12 months, has lack of transportation kept you from medical appointments or from getting medications?: Yes    Lack of Transportation (Non-Medical): Yes  Physical Activity: Not on file  Stress: Not on file  Social Connections: Not on file     Family History: The patient's family history includes Asthma in her brother; Depression in her brother; Diabetes in her maternal grandmother and mother; Hodgkin's lymphoma in her maternal aunt; Hypertension in her mother and sister; Lung cancer in her  paternal grandmother; Pancreatic cancer in her paternal grandfather.  ROS:   Please see the history of present illness.     All other systems reviewed and are negative.  EKGs/Labs/Other Studies Reviewed:    The following studies were reviewed today:  EKG Interpretation Date/Time:  Monday March 03 2024 10:55:06 EDT Ventricular Rate:  76 PR Interval:  194 QRS Duration:  88 QT Interval:  418 QTC Calculation: 470 R Axis:   1  Text Interpretation: Normal sinus rhythm Nonspecific T wave abnormality Confirmed by Darliss Rogue ([id number]) on 03/03/2024 11:03:20 AM    Recent Labs: 04/11/2023: Magnesium  2.2 02/01/2024: ALT 13; BUN 8; Creatinine, Ser 0.77; Hemoglobin 10.3; Platelets 312; Potassium 3.4; Sodium 139  Recent Lipid Panel No results found for: CHOL, TRIG, HDL, CHOLHDL, VLDL, LDLCALC, LDLDIRECT   Risk Assessment/Calculations:             Physical Exam:    VS:  BP 135/85 (BP Location: Right Arm, Patient Position: Sitting, Cuff Size: Large)   Pulse 76   Ht 5' 11 (1.803 m)   Wt (!) 362 lb 3.2 oz (164.3 kg)   LMP 02/06/2024   SpO2 94%   BMI 50.52 kg/m     Wt Readings from Last 3 Encounters:  03/03/24 (!) 362 lb 3.2 oz (164.3 kg)  01/18/24 (!) 350 lb 1.5 oz (158.8 kg)  11/21/23 (!) 350 lb (158.8 kg)     GEN:  Well nourished, well developed in no acute distress HEENT: Normal NECK: No JVD; No carotid bruits CARDIAC: RRR, no murmurs, rubs, gallops RESPIRATORY:  Clear to auscultation without rales, wheezing or rhonchi  ABDOMEN: Soft, non-tender, non-distended MUSCULOSKELETAL:  No edema; No deformity  SKIN: Warm and dry NEUROLOGIC:  Alert and oriented x 3 PSYCHIATRIC:  Normal affect   ASSESSMENT:    1. Shortness of breath   2. Morbid obesity (HCC)   3. Heart murmur    PLAN:    In order of problems listed above:  Shortness of breath, likely from morbid obesity/deconditioning.  Not consistent with angina.  Obtain echo to evaluate any  significant structural abnormalities. Morbid obesity, low-calorie diet, weight loss recommended. History of heart murmur, no murmur noted on exam today.  Evaluate for structural abnormalities with echo and see #1 above.  Follow-up in 3 months     Medication Adjustments/Labs and Tests Ordered: Current medicines are reviewed at length with the patient today.  Concerns regarding medicines are outlined above.  Orders Placed This Encounter  Procedures   EKG 12-Lead   ECHOCARDIOGRAM COMPLETE   No orders of the defined types were placed in this encounter.   Patient Instructions  Medication Instructions:  Your physician recommends that you continue on your current medications as directed. Please refer to the Current Medication list given to you today.   *If you need a refill on your cardiac medications before your next appointment, please call your pharmacy*  Lab Work: No labs ordered today  If you have labs (blood work) drawn today and your tests are completely normal, you will receive your results only by: MyChart Message (if you have MyChart) OR A paper copy in the mail If you have any lab test that is abnormal or we need to change your treatment, we will call you to review the results.  Testing/Procedures: Your physician has requested that you have an echocardiogram. Echocardiography is a painless test that uses sound waves to create images of your heart. It provides your doctor with information about the size and shape of your heart and how well your heart's chambers and valves are working.   You may receive an ultrasound enhancing agent through an IV if needed to better visualize your heart during the echo. This procedure takes approximately one hour.  There are no restrictions for this procedure.  This will take place at [address] [facility] [address]) [address], [place] [address]  Please note: We ask at that you not bring children with you during ultrasound (echo/  vascular) testing. Due to room size and safety concerns, children are not allowed in the ultrasound rooms during exams. Our front office staff cannot provide observation of children in our lobby area while testing is being conducted. An adult accompanying a patient to their appointment will only be allowed in the ultrasound room at the discretion of the ultrasound technician under special circumstances. We apologize for any inconvenience.   Follow-Up: At [facility], you and your health needs are our priority.  As part of our continuing mission to provide you with exceptional heart care, our providers are all part of one team.  This team includes your primary Cardiologist (physician) and Advanced Practice Providers or APPs (Physician Assistants and Nurse Practitioners) who all work together to provide you with the care you need, when you need it.  Your next appointment:   2 month(s)  Provider:   You may see Dr. Darliss or one of the following Advanced Practice Providers on your designated Care Team:   Lonni Meager, NP Lesley Maffucci, PA-C Bernardino Bring, PA-C Cadence [place], PA-C Tylene Lunch, NP Barnie Hila, NP    We recommend signing up for the patient portal called MyChart.  Sign up information is provided on this After Visit Summary.  MyChart is used to connect with patients for Virtual Visits (Telemedicine).  Patients are able to view lab/test results, encounter notes, upcoming appointments, etc.  Non-urgent messages can be sent to your provider as well.   To learn more about what you can do with MyChart, go to [email or web address].          Signed, Redell Darliss, MD  03/03/2024 12:34 PM    [facility]

## 2024-03-03 NOTE — Patient Instructions (Signed)
 Medication Instructions:  Your physician recommends that you continue on your current medications as directed. Please refer to the Current Medication list given to you today.   *If you need a refill on your cardiac medications before your next appointment, please call your pharmacy*  Lab Work: No labs ordered today  If you have labs (blood work) drawn today and your tests are completely normal, you will receive your results only by: MyChart Message (if you have MyChart) OR A paper copy in the mail If you have any lab test that is abnormal or we need to change your treatment, we will call you to review the results.  Testing/Procedures: Your physician has requested that you have an echocardiogram. Echocardiography is a painless test that uses sound waves to create images of your heart. It provides your doctor with information about the size and shape of your heart and how well your heart's chambers and valves are working.   You may receive an ultrasound enhancing agent through an IV if needed to better visualize your heart during the echo. This procedure takes approximately one hour.  There are no restrictions for this procedure.  This will take place at [address] [facility] [address]) [address], [place] [address]  Please note: We ask at that you not bring children with you during ultrasound (echo/ vascular) testing. Due to room size and safety concerns, children are not allowed in the ultrasound rooms during exams. Our front office staff cannot provide observation of children in our lobby area while testing is being conducted. An adult accompanying a patient to their appointment will only be allowed in the ultrasound room at the discretion of the ultrasound technician under special circumstances. We apologize for any inconvenience.   Follow-Up: At [facility], you and your health needs are our priority.  As part of our continuing mission to provide you with exceptional heart  care, our providers are all part of one team.  This team includes your primary Cardiologist (physician) and Advanced Practice Providers or APPs (Physician Assistants and Nurse Practitioners) who all work together to provide you with the care you need, when you need it.  Your next appointment:   2 month(s)  Provider:   You may see Dr. Darliss or one of the following Advanced Practice Providers on your designated Care Team:   Lonni Meager, NP Lesley Maffucci, PA-C Bernardino Bring, PA-C Cadence [place], PA-C Tylene Lunch, NP Barnie Hila, NP    We recommend signing up for the patient portal called MyChart.  Sign up information is provided on this After Visit Summary.  MyChart is used to connect with patients for Virtual Visits (Telemedicine).  Patients are able to view lab/test results, encounter notes, upcoming appointments, etc.  Non-urgent messages can be sent to your provider as well.   To learn more about what you can do with MyChart, go to [email or web address].

## 2024-03-25 ENCOUNTER — Ambulatory Visit: Payer: Self-pay | Admitting: Pediatrics

## 2024-04-24 ENCOUNTER — Ambulatory Visit: Payer: Self-pay | Attending: Cardiology

## 2024-05-16 ENCOUNTER — Ambulatory Visit: Payer: Self-pay | Attending: Cardiology | Admitting: Cardiology
